# Patient Record
Sex: Female | Born: 1935 | Race: White | Hispanic: No | Marital: Married | Smoking: Former smoker
Health system: Southern US, Community
[De-identification: ages and names within clinical notes are randomized; demographics above are authoritative.]

## PROBLEM LIST (undated history)

## (undated) DIAGNOSIS — I10 Essential (primary) hypertension: Secondary | ICD-10-CM

## (undated) DIAGNOSIS — R9431 Abnormal electrocardiogram [ECG] [EKG]: Secondary | ICD-10-CM

## (undated) DIAGNOSIS — E785 Hyperlipidemia, unspecified: Secondary | ICD-10-CM

## (undated) HISTORY — DX: Abnormal electrocardiogram (ECG) (EKG): R94.31

## (undated) HISTORY — DX: Hyperlipidemia, unspecified: E78.5

## (undated) HISTORY — DX: Essential (primary) hypertension: I10

---

## 1969-12-21 HISTORY — PX: HEMORRHOID SURGERY: SHX153

## 1976-12-21 HISTORY — PX: OTHER SURGICAL HISTORY: SHX169

## 2004-12-21 HISTORY — PX: CATARACT EXTRACTION: SUR2

## 2005-08-11 ENCOUNTER — Ambulatory Visit: Payer: Self-pay | Admitting: Internal Medicine

## 2005-08-13 ENCOUNTER — Ambulatory Visit: Payer: Self-pay | Admitting: Cardiology

## 2005-09-02 ENCOUNTER — Ambulatory Visit: Payer: Self-pay | Admitting: Internal Medicine

## 2005-10-15 ENCOUNTER — Ambulatory Visit: Payer: Self-pay | Admitting: Internal Medicine

## 2005-11-26 ENCOUNTER — Ambulatory Visit: Payer: Self-pay | Admitting: Internal Medicine

## 2008-12-27 ENCOUNTER — Ambulatory Visit (HOSPITAL_BASED_OUTPATIENT_CLINIC_OR_DEPARTMENT_OTHER): Admission: RE | Admit: 2008-12-27 | Discharge: 2008-12-27 | Payer: Self-pay | Admitting: Orthopedic Surgery

## 2009-12-31 ENCOUNTER — Ambulatory Visit (HOSPITAL_BASED_OUTPATIENT_CLINIC_OR_DEPARTMENT_OTHER): Admission: RE | Admit: 2009-12-31 | Discharge: 2009-12-31 | Payer: Self-pay | Admitting: Orthopedic Surgery

## 2010-01-21 ENCOUNTER — Telehealth (INDEPENDENT_AMBULATORY_CARE_PROVIDER_SITE_OTHER): Payer: Self-pay | Admitting: *Deleted

## 2010-01-21 ENCOUNTER — Ambulatory Visit: Payer: Self-pay | Admitting: Cardiovascular Disease

## 2010-01-22 ENCOUNTER — Encounter (HOSPITAL_COMMUNITY): Admission: RE | Admit: 2010-01-22 | Discharge: 2010-03-27 | Payer: Self-pay | Admitting: Cardiovascular Disease

## 2010-01-22 ENCOUNTER — Ambulatory Visit: Payer: Self-pay | Admitting: Cardiovascular Disease

## 2010-01-22 ENCOUNTER — Ambulatory Visit: Payer: Self-pay

## 2010-04-09 ENCOUNTER — Ambulatory Visit: Payer: Self-pay | Admitting: Cardiovascular Disease

## 2010-08-07 ENCOUNTER — Ambulatory Visit: Payer: Self-pay | Admitting: Cardiovascular Disease

## 2011-01-20 NOTE — Assessment & Plan Note (Signed)
Summary: Cardiology Nuclear Study  Nuclear Med Background Indications for Stress Test: Evaluation for Ischemia   History: Abnormal EKG, Asthma, Myocardial Perfusion Study  History Comments: 5-10 yrs ago MPS:([facility])OK per patient  Symptoms: Chest Tightness, Diaphoresis, DOE, Palpitations  Symptoms Comments: CP>jaw and (L) arm. Last episode of UO:[id number] am, 01/19/10.    Nuclear Pre-Procedure Cardiac Risk Factors: Family History - CAD, History of Smoking, Hypertension, Lipids Caffeine/Decaff Intake: None NPO After: 5:30 PM Lungs: Clear.  O2 sat 98% on RA.  Albuterol inhaler, 2 puffs, prior to lexiscan. IV 0.9% NS with Angio Cath: 22g     IV Site: (R) AC IV Started by: Irven Baltimore RN Chest Size (in) 40     Cup Size C     Height (in): 59 Weight (lb): 148 BMI: 30.00  Nuclear Med Study 1 or 2 day study:  1 day     Stress Test Type:  Carlton Adam Reading MD:  Jenkins Rouge, MD     Referring MD:  Vivi Martens, MD Resting Radionuclide:  Technetium 60m Tetrofosmin     Resting Radionuclide Dose:  10.9 mCi  Stress Radionuclide:  Technetium 37m Tetrofosmin     Stress Radionuclide Dose:  33.0 mCi   Stress Protocol   Lexiscan: 0.4 mg   Stress Test Technologist:  Valetta Fuller CMA-N     Nuclear Technologist:  Mariann Laster Deal RT-N  Rest Procedure  Myocardial perfusion imaging was performed at rest 45 minutes following the intravenous administration of Myoview Technetium 66m Tetrofosmin.  Stress Procedure  The patient received IV Lexiscan 0.4 mg over 15-seconds.  Myoview injected at 30-seconds.  There were no significant changes with lexiscan.  She did c/o throat tightness.  Quantitative spect images were obtained after a 45 minute delay.  QPS Raw Data Images:  Normal; no motion artifact; normal heart/lung ratio. Stress Images:  NI: Uniform and normal uptake of tracer in all myocardial segments. Rest Images:  Normal homogeneous uptake in all areas of the myocardium. Subtraction  (SDS):  Normal Transient Ischemic Dilatation:  1.08  (Normal <1.22)  Lung/Heart Ratio:  .28  (Normal <0.45)  Quantitative Gated Spect Images QGS EDV:  38 ml QGS ESV:  7 ml QGS EF:  82 % QGS cine images:  Normal  Findings Normal nuclear study      Overall Impression  Exercise Capacity: Lexiscan BP Response: Normal blood pressure response. Clinical Symptoms: Throat Tightness ECG Impression: LVH Overall Impression: Normal stress nuclear study. Overall Impression Comments: Normal

## 2011-01-20 NOTE — Progress Notes (Signed)
Summary: Nuclear Pre-Procedure  Phone Note Outgoing Call Call back at [facility] Phone [phone]   Call placed by: Eliezer Lofts, EMT-P,  January 21, 2010 1:22 PM Call placed to: Patient Action Taken: Phone Call Completed Summary of Call: Reviewed information on Myoview Information Sheet (see scanned document for further details).  Spoke with Patient.    Nuclear Med Background Indications for Stress Test: Evaluation for Ischemia   History: Abnormal EKG   Symptoms: Chest Pain    Nuclear Pre-Procedure Cardiac Risk Factors: Hypertension, Lipids

## 2011-03-08 LAB — BASIC METABOLIC PANEL
BUN: 20 mg/dL (ref 6–23)
CO2: 28 mEq/L (ref 19–32)
Calcium: 9.5 mg/dL (ref 8.4–10.5)
Creatinine, Ser: 1.38 mg/dL — ABNORMAL HIGH (ref 0.4–1.2)
GFR calc Af Amer: 45 mL/min — ABNORMAL LOW (ref >60)
GFR calc non Af Amer: 37 mL/min — ABNORMAL LOW (ref >60)
Sodium: 138 mEq/L (ref 135–145)

## 2011-04-06 LAB — BASIC METABOLIC PANEL
Calcium: 9.3 mg/dL (ref 8.4–10.5)
GFR calc non Af Amer: 46 mL/min — ABNORMAL LOW (ref >60)

## 2011-05-05 NOTE — Assessment & Plan Note (Signed)
[place] [facility]ARDIOLOGY OFFICE NOTE   NAME:[name], [name] [name]                            MRN:          [id number]  DATE:01/21/2010                            DOB:          04-09-1933    CHIEF COMPLAINT:  Abnormal EKG.   HISTORY OF PRESENT ILLNESS:  [name] [name] [name] is 77 year old white female with a  past medical history significant for hypertension, hyperlipidemia,  hypothyroidism and a family history of premature coronary disease who is  presenting with an abnormal EKG and intermittent left arm discomfort.  The patient states that approximately 1 month ago, she was told that she  had an abnormal EKG after she had surgery on her hand that was  uneventful.  The patient states that around that time, she awoke around  2:00 a.m. with an aching in her left arm and left jaw as well as some  diaphoresis.  The patient states she took aspirin and the pain resolved  within 15 minutes.  Several nights later, she had an almost identical  episode.  She went several weeks without any difficulties.  However 2  days before this office visit while cooking breakfast, she had similar  type symptoms.  Of note, the patient states she used to have left arm  discomfort with exertion that was evaluated with a stress test  approximately 5 years ago, which was within normal limits.  The arm  discomfort eventually subsided.  She does not remember if there were  associated symptoms at that time.  Other than the above-mentioned  symptoms, the patient has been in her normal state of health.  She  denies any increase in dyspnea on exertion, lower extremity edema, PND  or orthopnea.   PAST MEDICAL HISTORY:  As above.  In addition, the patient has had a  cyst removed from her breast, bladder tack surgery, hemorrhoidectomy,  hysterectomy.   SOCIAL HISTORY:  She smoked for about 38 years.  However, she quit in  1991.  She does not drink significant amounts of  alcohol.   FAMILY HISTORY:  Positive for premature coronary disease and she  believes her mother developed obstructive coronary disease in her 30s.   ALLERGIES:  THE PATIENT STATES SHE HAD A RASH AFTER TAKING CARDIZEM AND  AUGMENTIN SO THEY TOLD HER NOT TO TAKE EITHER AGAIN.  SHE ALSO STATES AN  ALLERGY TO REGLAN, MORPHINE AND CODEINE.   MEDICATIONS:  1. Simvastatin 40 mg nightly.  2. Losartan/HCTZ 100/25 mg daily.  3. Ranitidine 300 mg nightly.  4. Omeprazole 20 mg b.i.d.  5. Isosorbide 30 mg daily.  6. Levothyroxine  50 mcg daily,  7. Fexofenadine 180 mg daily.   REVIEW OF SYSTEMS:  As in HPI.  Other systems were reviewed and are  negative.   PHYSICAL EXAM:  Blood pressure in the right arm 149/88, left arm is  152/82, pulse is 94, saturating 97% on room air.  She weighs 149 pounds.  GENERAL:  No acute distress.  HEENT:  Normocephalic, atraumatic.  NECK:  Supple.  There is no JVD.  No carotid bruits.  HEART:  Regular rate and rhythm without murmur, rub or gallop.  LUNGS:  Clear bilaterally.  ABDOMEN:  Soft, nontender, nondistended.  EXTREMITIES:  Without edema.  SKIN:  Warm and dry.  MUSCULOSKELETAL:  5/5 bilateral upper and lower extremity [name].  PSYCHIATRIC:  The patient is appropriate with normal levels of insight.   EKG taken today in the clinic, independently interrupted by myself,  demonstrates normal sinus rhythm with a 1-mm ST-segment depression and T-  wave inversion in leads V4-V6.  She has T-wave inversion in the inferior  leads as well.  Indeed, ST-segment changes may be secondary to LVH as  she has borderline criteria for left ventricular hypertrophy.  Compared  to EKG from 1 year ago dated January 2010, there is really no  significant change except the T-wave abnormalities in the inferior leads  are perhaps a bit more impressive.   REVIEW OF THE PATIENT'S LABS:  From October 2010:  BMP was within normal  limits including a creatinine of 1.1.  CBC was  within normal limits  including a hemoglobin of 13.8 and platelet count of 243.  Her TSH at  that point was 0.079 and an appropriate change to her Synthroid dosing  was made.  The patient's stress test from 5 years ago is not currently  available, but normal per her report.   ASSESSMENT AND PLAN:  This is a 77 year old female with multiple risk  factors for coronary disease presenting with symptoms that are  concerning for angina.  The EKG abnormalities are less concerning as  there is no significant change from 1 year ago.  The patient's blood  pressure is not controlled.   PLAN:  We talked about the risks and benefits of left heart  catheterization verses a noninvasive stress test.  At this point, we  will proceed with a nuclear stress test.  The patient is to start  aspirin 81 mg every day.  In addition, we will start her on Lopressor 25  mg b.i.d. in addition to her other antihypertensives as her blood  pressure is not yet at goal.  We will contact the patient once the  results of the stress test are obtained.     Arlee Muslim, MD  Electronically Signed    SGA/MedQ  DD: 01/21/2010  DT: 01/21/2010  Job #: [id number]

## 2011-05-05 NOTE — Assessment & Plan Note (Signed)
[place] CARDIOLOGY OFFICE NOTE   NAME:[name] [name]                            MRN:          [id number]  DATE:04/09/2010                            DOB:          1933/03/20    PROBLEM LIST:  1. Hypertension.  2. Hyperlipidemia.  3. Family history of premature coronary artery disease.   INTERVAL HISTORY:  The patient states she has been doing very well since  her last visit.  She is walking every other day approximately one mile.  It takes approximately 12 minutes. She denies any episodes of chest  discomfort or shortness of breath.  She also denies any dizziness,  syncope or lower extremity edema.  Her main problem tends to be from  bilateral hip pain secondary to arthritis.  She is compliant with her  medications and is tolerating them well.  She does endorse sleepiness  and fatigue in the afternoons.  She states her husband says that she  snores.   PHYSICAL EXAMINATION:  VITAL SIGNS:  Blood pressure 128/73, pulse is 74,  weight is 152 pounds, and satting 97% on room air.  GENERAL:  She is in no acute distress.  HEENT:  Normocephalic, atraumatic.  HEART:  Regular rate and rhythm without murmur, rub or gallop.  LUNGS:  Clear bilaterally.  ABDOMEN:  Soft, nontender, nondistended.  EXTREMITIES:  Without edema.  SKIN:  Warm and dry.   Review of the patient's stress test since the last office visit, she had  an ejection fraction calculated to be 82%.  There is no evidence of  ischemia of the study.   ASSESSMENT AND PLAN:  A 77 year old female with multiple risk factors of  coronary artery disease who is not having any symptoms consistent with  angina, heart failure or arrhythmia.  She has had a negative nuclear  stress test that was ordered secondary to some intermittent left arm  discomfort and an abnormal EKG.  The patient's daytime fatigue and  somnolence is concerning for obstructive sleep apnea.  We have  offered  her a sleep test and she states that she will discuss this with her  husband then get back to [place].  I will see her back in 4 months' time.     Arlee Muslim, MD  Electronically Signed    SGA/MedQ  DD: 04/09/2010  DT: 04/10/2010  Job #: [id number]

## 2011-05-05 NOTE — Assessment & Plan Note (Signed)
[place] CARDIOLOGY OFFICE NOTE   NAME:[name] [name], [name] [name]                            MRN:          [id number]  DATE:08/07/2010                            DOB:          August 14, 1933    PROBLEM LIST:  1. Hypertension.  2. Hyperlipidemia.  3. Family history of premature coronary artery disease.  4. Abnormal baseline ECG with inverted T-waves in the anterior leads      with negative nuclear stress testing.   INTERVAL HISTORY:  The patient overall is doing very well.  She denies  any chest pain, neck pain, or dyspnea.  She has not had any  palpitations, presyncope, or syncope.  Her blood pressure has been  better controlled.  She is not reporting any side effects with  medications.  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   MEDICATIONS:  1. Levothyroxine once daily.  2. Isosorbide 30 mg once daily.  3. Losartan/hydrochlorothiazide 100/25 mg once daily.  4. Ranitidine 300 mg once daily.  5. Omeprazole 20 mg twice daily.  6. Klor-Con 20 mEq once daily.  7. Simvastatin 40 mg at bedtime.  8. Multivitamin once daily.  9. Amlodipine 10 mg daily.  10.Lopressor 50 mg half a tablet twice daily.  11.Aspirin 81 mg once daily.  12.Zoloft 50 mg once daily.   PHYSICAL EXAMINATION:  VITAL SIGNS:  Weight is 152.4 pounds, blood  pressure is 123/69, pulse is 55, oxygen saturation is 99% on room air.  NECK:  No JVD or carotid bruits.  LUNGS:  Clear to auscultation.  HEART:  Regular rate and rhythm with no gallops or murmurs.  ABDOMEN:  Benign, nontender, nondistended.  EXTREMITIES:  No clubbing, cyanosis, or edema.   IMPRESSION:  1. Previous atypical left arm pain and abnormal ECG.  This was      followed by a nuclear stress test which showed no signs of ischemia      with normal ejection fraction.  At this time, she is not having any      symptoms suggestive of angina.  I recommend treating  her underlying      risk factors as is being done.  We will continue with aspirin for      now.  She does have significant ECG changes that could possibly      have endothelial dysfunction.  2. Hypertension.  Blood pressure is now well controlled with current      medications.  3. Hyperlipidemia.  Her most recent lipid profile showed a total      cholesterol of 179, triglyceride 151, HDL 51, and LDL of 98.  I      think this is at target.  Her LDL target should be less than 100.      The patient will follow up with [place] as needed based on her symptoms.     Kathlyn Sacramento, MD  Electronically Signed    MA/MedQ  DD: 08/07/2010  DT: 08/08/2010  Job #: [id number]

## 2011-05-05 NOTE — Op Note (Signed)
NAME:  [name] [name], [name] [name]                   ACCOUNT NO.:  [id number]   MEDICAL RECORD NO.:  [id number]          PATIENT TYPE:  AMB   LOCATION:  [facility]                          FACILITY:  [facility]   PHYSICIAN:  Daryll Brod, M.D.       DATE OF BIRTH:  1933-10-03   DATE OF PROCEDURE:  12/27/2008  DATE OF DISCHARGE:                               OPERATIVE REPORT   PREOPERATIVE DIAGNOSIS:  Stenosing tenosynovitis, left middle finger.   POSTOPERATIVE DIAGNOSIS:  Stenosing tenosynovitis, left middle finger.   OPERATION:  Release A1 pulley, left middle finger.   SURGEON:  Daryll Brod, MD   ASSISTANT:  Dimas Millin, RN   ANESTHESIA:  Forearm based IV regional.   HISTORY:  The patient is a 77 year old female with a history of  triggering of left middle finger.  This has not responded to  conservative treatment.  She has elected to undergo surgical release.  Her postoperative course was discussed along with the risks and  complications.  She is aware that there is no guarantee with the  surgery; possibility of infection; recurrence; injury to arteries,  nerves, tendons; incomplete relief of symptoms; and dystrophy.  In the  preoperative area, the patient was seen.  The extremity was marked by  both the patient and surgeon.  Antibiotics given.   PROCEDURE:  The patient was brought to the operating room where a  forearm based IV regional anesthetic was carried out without difficulty.  She was prepped using DuraPrep in the supine position, left arm free.  The block was undertaken under the direction of Dr. Ola Spurr.  Time-  out was taken.  An oblique incision was made over the A1 pulley, left  middle finger.  She had some feeling.  A local anesthetic was added with  1% Xylocaine without epinephrine.  The wound was deepened to the flexor  sheath.  Retractors were placed protecting the neurovascular bundles  radially and ulnarly.  An incision was then made on the radial aspect of  the A1 pulley.  A small incision  was made centrally in the A2 pulley.  No further lesions were identified.  The finger placed through full  range motion, no further triggering was noted.  The wound was irrigated.  Skin was then closed with interrupted 5-0 Vicryl Rapide sutures.  A  sterile compressive dressing to the hand was applied, fingers free.  The  patient tolerated the procedure well and was taken to the recovery room  for observation in satisfactory condition.  She will be discharged home  to return to the [facility] in 1 week on Darvocet-N 100.           ______________________________  Daryll Brod, M.D.     GK/MEDQ  D:  12/27/2008  T:  12/28/2008  Job:  [id number]

## 2011-05-05 NOTE — Letter (Signed)
January 21, 2010    Raynelle Bring, DO  [facility]  [address]  [place], [place]  [address]   RE:  MCKENA, KRUER  MRN:  [id number]  /  DOB:  24-Jan-1933   Dear Dr. Cathleen Fears:   I had the pleasure of seeing your patient, Nicolas Eisenhauer, in Cardiology  Clinic this morning.  As you know she is a 77 year old female with  multiple risk factors for coronary disease who is now having some  symptoms that are concerning for angina.  She also had an abnormal EKG,  but in reviewing her history the EKG is largely unchanged from January  2010.  We discussed at length the risks and benefits of heart  catheterization versus a nuclear stress test, and will proceed with an  initial stress test.  I have also asked her to start taking aspirin on a  daily basis, and have added Lopressor 25 mg twice a day to her medical  regimen.   Thank you for the referral of this patient, and I look forward to  following her along with you.  Please free to contact my office anytime  with any questions or concerns.    Sincerely,      Arlee Muslim, MD  Electronically Signed    SGA/MedQ  DD: 01/21/2010  DT: 01/21/2010  Job #: [id number]

## 2011-05-06 ENCOUNTER — Other Ambulatory Visit: Payer: Self-pay | Admitting: Cardiovascular Disease

## 2011-06-16 ENCOUNTER — Encounter: Payer: Self-pay | Admitting: Cardiovascular Disease

## 2011-09-13 ENCOUNTER — Encounter: Payer: Self-pay | Admitting: Cardiovascular Disease

## 2011-10-12 ENCOUNTER — Other Ambulatory Visit: Payer: Self-pay | Admitting: Cardiovascular Disease

## 2011-10-19 ENCOUNTER — Encounter: Payer: Self-pay | Admitting: Cardiovascular Disease

## 2012-02-03 ENCOUNTER — Other Ambulatory Visit: Payer: Self-pay | Admitting: Cardiovascular Disease

## 2013-02-16 ENCOUNTER — Other Ambulatory Visit: Payer: Self-pay | Admitting: Cardiovascular Disease

## 2013-02-17 ENCOUNTER — Telehealth: Payer: Self-pay | Admitting: *Deleted

## 2013-02-17 NOTE — Telephone Encounter (Signed)
Pt pharmacy requesting refill called pt to schedule appointment for future refills left message to call back.

## 2016-02-13 DIAGNOSIS — M1990 Unspecified osteoarthritis, unspecified site: Secondary | ICD-10-CM | POA: Insufficient documentation

## 2016-02-13 DIAGNOSIS — J449 Chronic obstructive pulmonary disease, unspecified: Secondary | ICD-10-CM | POA: Insufficient documentation

## 2016-02-13 DIAGNOSIS — F411 Generalized anxiety disorder: Secondary | ICD-10-CM | POA: Insufficient documentation

## 2016-02-13 DIAGNOSIS — I129 Hypertensive chronic kidney disease with stage 1 through stage 4 chronic kidney disease, or unspecified chronic kidney disease: Secondary | ICD-10-CM | POA: Insufficient documentation

## 2016-02-13 DIAGNOSIS — E559 Vitamin D deficiency, unspecified: Secondary | ICD-10-CM | POA: Insufficient documentation

## 2016-02-13 DIAGNOSIS — K219 Gastro-esophageal reflux disease without esophagitis: Secondary | ICD-10-CM | POA: Insufficient documentation

## 2016-02-13 DIAGNOSIS — E039 Hypothyroidism, unspecified: Secondary | ICD-10-CM | POA: Insufficient documentation

## 2016-02-13 DIAGNOSIS — R7301 Impaired fasting glucose: Secondary | ICD-10-CM | POA: Insufficient documentation

## 2016-02-13 DIAGNOSIS — K449 Diaphragmatic hernia without obstruction or gangrene: Secondary | ICD-10-CM | POA: Insufficient documentation

## 2016-02-13 DIAGNOSIS — N184 Chronic kidney disease, stage 4 (severe): Secondary | ICD-10-CM

## 2016-02-13 DIAGNOSIS — B0229 Other postherpetic nervous system involvement: Secondary | ICD-10-CM | POA: Insufficient documentation

## 2016-02-13 DIAGNOSIS — J309 Allergic rhinitis, unspecified: Secondary | ICD-10-CM | POA: Insufficient documentation

## 2016-02-13 DIAGNOSIS — E7849 Other hyperlipidemia: Secondary | ICD-10-CM | POA: Insufficient documentation

## 2016-04-17 DIAGNOSIS — D509 Iron deficiency anemia, unspecified: Secondary | ICD-10-CM | POA: Insufficient documentation

## 2016-06-24 DIAGNOSIS — H9190 Unspecified hearing loss, unspecified ear: Secondary | ICD-10-CM | POA: Insufficient documentation

## 2018-03-21 DIAGNOSIS — R197 Diarrhea, unspecified: Secondary | ICD-10-CM | POA: Insufficient documentation

## 2018-03-21 DIAGNOSIS — R911 Solitary pulmonary nodule: Secondary | ICD-10-CM | POA: Insufficient documentation

## 2018-03-21 DIAGNOSIS — R5383 Other fatigue: Secondary | ICD-10-CM | POA: Insufficient documentation

## 2018-04-21 DIAGNOSIS — I2510 Atherosclerotic heart disease of native coronary artery without angina pectoris: Secondary | ICD-10-CM | POA: Insufficient documentation

## 2018-05-23 DIAGNOSIS — L0390 Cellulitis, unspecified: Secondary | ICD-10-CM | POA: Insufficient documentation

## 2018-08-05 DIAGNOSIS — M542 Cervicalgia: Secondary | ICD-10-CM | POA: Insufficient documentation

## 2018-10-09 DIAGNOSIS — M25552 Pain in left hip: Secondary | ICD-10-CM | POA: Insufficient documentation

## 2018-10-09 DIAGNOSIS — M5136 Other intervertebral disc degeneration, lumbar region: Secondary | ICD-10-CM | POA: Insufficient documentation

## 2018-10-26 DIAGNOSIS — E876 Hypokalemia: Secondary | ICD-10-CM | POA: Insufficient documentation

## 2018-10-26 DIAGNOSIS — N289 Disorder of kidney and ureter, unspecified: Secondary | ICD-10-CM | POA: Insufficient documentation

## 2018-12-15 DIAGNOSIS — R000 Tachycardia, unspecified: Secondary | ICD-10-CM | POA: Insufficient documentation

## 2018-12-15 DIAGNOSIS — M25572 Pain in left ankle and joints of left foot: Secondary | ICD-10-CM | POA: Insufficient documentation

## 2018-12-16 DIAGNOSIS — I10 Essential (primary) hypertension: Secondary | ICD-10-CM

## 2018-12-16 DIAGNOSIS — J449 Chronic obstructive pulmonary disease, unspecified: Secondary | ICD-10-CM

## 2018-12-16 DIAGNOSIS — N183 Chronic kidney disease, stage 3 (moderate): Secondary | ICD-10-CM

## 2018-12-16 DIAGNOSIS — I340 Nonrheumatic mitral (valve) insufficiency: Secondary | ICD-10-CM

## 2018-12-16 DIAGNOSIS — I4892 Unspecified atrial flutter: Secondary | ICD-10-CM | POA: Diagnosis not present

## 2018-12-16 DIAGNOSIS — R112 Nausea with vomiting, unspecified: Secondary | ICD-10-CM

## 2018-12-16 DIAGNOSIS — I361 Nonrheumatic tricuspid (valve) insufficiency: Secondary | ICD-10-CM

## 2018-12-16 DIAGNOSIS — I4891 Unspecified atrial fibrillation: Secondary | ICD-10-CM

## 2018-12-17 DIAGNOSIS — I4892 Unspecified atrial flutter: Secondary | ICD-10-CM

## 2018-12-17 DIAGNOSIS — N183 Chronic kidney disease, stage 3 (moderate): Secondary | ICD-10-CM

## 2018-12-17 DIAGNOSIS — E876 Hypokalemia: Secondary | ICD-10-CM | POA: Diagnosis not present

## 2018-12-22 DIAGNOSIS — R233 Spontaneous ecchymoses: Secondary | ICD-10-CM | POA: Insufficient documentation

## 2018-12-22 DIAGNOSIS — I483 Typical atrial flutter: Secondary | ICD-10-CM | POA: Insufficient documentation

## 2018-12-22 DIAGNOSIS — R7989 Other specified abnormal findings of blood chemistry: Secondary | ICD-10-CM | POA: Insufficient documentation

## 2019-01-02 DIAGNOSIS — L239 Allergic contact dermatitis, unspecified cause: Secondary | ICD-10-CM | POA: Insufficient documentation

## 2019-01-03 ENCOUNTER — Ambulatory Visit (INDEPENDENT_AMBULATORY_CARE_PROVIDER_SITE_OTHER): Payer: Medicare Other | Admitting: Cardiology

## 2019-01-03 ENCOUNTER — Encounter: Payer: Self-pay | Admitting: Cardiology

## 2019-01-03 VITALS — BP 126/72 | HR 118 | Ht <= 58 in | Wt 128.0 lb

## 2019-01-03 DIAGNOSIS — I129 Hypertensive chronic kidney disease with stage 1 through stage 4 chronic kidney disease, or unspecified chronic kidney disease: Secondary | ICD-10-CM

## 2019-01-03 DIAGNOSIS — I480 Paroxysmal atrial fibrillation: Secondary | ICD-10-CM

## 2019-01-03 DIAGNOSIS — I2510 Atherosclerotic heart disease of native coronary artery without angina pectoris: Secondary | ICD-10-CM | POA: Diagnosis not present

## 2019-01-03 DIAGNOSIS — N289 Disorder of kidney and ureter, unspecified: Secondary | ICD-10-CM | POA: Diagnosis not present

## 2019-01-03 DIAGNOSIS — N184 Chronic kidney disease, stage 4 (severe): Secondary | ICD-10-CM

## 2019-01-03 MED ORDER — METOPROLOL TARTRATE 50 MG PO TABS: 100.0000 mg | ORAL_TABLET | Freq: Two times a day (BID) | ORAL | 2 refills | Status: DC

## 2019-01-03 MED ORDER — RIVAROXABAN 15 MG PO TABS: 15.0000 mg | ORAL_TABLET | Freq: Every day | ORAL | 0 refills | Status: DC

## 2019-01-03 NOTE — Progress Notes (Signed)
Cardiology Office Note:    Date:  01/03/2019   ID:  [name] [name], DOB 05-22-1933, MRN [id number]  PCP:  Maggie Schwalbe, PA-C  Cardiologist:  Jenean Lindau, MD   Referring MD: Maggie Schwalbe, PA-C    ASSESSMENT:    1. Paroxysmal atrial fibrillation (HCC)   2. Benign hypertension with CKD (chronic kidney disease) stage IV ([place])   3. Coronary artery disease involving native coronary artery of native heart without angina pectoris   4. Renal insufficiency    PLAN:    In order of problems listed above:  1. I discussed my findings with the patient at extensive length and [facility] records were reviewed. 2. I discussed with the patient atrial fibrillation, disease process. Management and therapy including rate and rhythm control, anticoagulation benefits and potential risks were discussed extensively with the patient. Patient had multiple questions which were answered to patient's satisfaction. 3. I discussed with the patient that she is in atrial fibrillation now.  I made the following changes to her medications.  I stopped amlodipine and isosorbide.  I doubled up on the metoprolol to 100 mg daily.  She had a rash from Eliquis and she is willing to try the medications we will give her Xarelto 15 mg daily.  She will be back in 1 month.  At that time we will reevaluate her.  She will keep a track of her pulse and blood pressures at home and send it to [place] in a week.  She will be seen in follow-up appointment in a month or earlier if she has any concerns.  At that time we will consider her for cardioversion.  She knows to go to the nearest emergency room for any significant concerns.  She and her daughter had multiple questions which were answered to their satisfaction.   Medication Adjustments/Labs and Tests Ordered: Current medicines are reviewed at length with the patient today.  Concerns regarding medicines are outlined above.  No orders of the defined types were placed in this  encounter.  No orders of the defined types were placed in this encounter.    No chief complaint on file.    History of Present Illness:    [name] [name] is a 85 y.o. female.  Patient was at [facility] with atrial fibrillation and rapid ventricular rate.  I reviewed the records extensively and it mentions that the patient converted to sinus rhythm.  Patient was discharged home on Eliquis 2.5 mg twice daily but she has stopped taking it because of a rash.  No chest pain orthopnea or PND.  She complains of feeling tired on exertion.  She has renal insufficiency.  Her daughter is very supportive and accompanies her for this visit.  At the time of my evaluation, the patient is alert awake oriented and in no distress.  Past Medical History:  Diagnosis Date  . Abnormal EKG   . HLD (hyperlipidemia)   . HTN (hypertension)     Past Surgical History:  Procedure Laterality Date  . CATARACT EXTRACTION  2006  . [place]  . hysterectomy - unknown type  1978    Current Medications: Current Meds  Medication Sig  . amLODipine (NORVASC) 10 MG tablet Take 10 mg by mouth daily.    . diazepam (VALIUM) 2 MG tablet Take 2 mg by mouth as needed.    . isosorbide mononitrate (IMDUR) 30 MG 24 hr tablet Take 30 mg by mouth daily.    Marland Kitchen levothyroxine (  SYNTHROID, LEVOTHROID) 75 MCG tablet Take 75 mcg by mouth daily.   . metoprolol (LOPRESSOR) 50 MG tablet TAKE 1/2 TABLET BY MOUTH TWICE DAILY. (Patient taking differently: Take 50 mg by mouth 2 (two) times daily. )  . montelukast (SINGULAIR) 10 MG tablet Take 1 tablet by mouth daily.  Marland Kitchen omeprazole (PRILOSEC) 20 MG capsule Take 20 mg by mouth 2 (two) times daily.    . simvastatin (ZOCOR) 20 MG tablet Take 20 mg by mouth at bedtime.      Allergies:   Amoxicillin-pot clavulanate; Codeine; Metoclopramide hcl; Morphine; Nadolol; Sulfamethoxazole-trimethoprim; and Valacyclovir   Social History   Socioeconomic History  . Marital status:  Married    Spouse name: Not on file  . Number of children: Not on file  . Years of education: Not on file  . Highest education level: Not on file  Occupational History  . Occupation: Retired  Scientific laboratory technician  . Financial resource strain: Not on file  . Food insecurity:    Worry: Not on file    Inability: Not on file  . Transportation needs:    Medical: Not on file    Non-medical: Not on file  Tobacco Use  . Smoking status: Former Smoker    Last attempt to quit: 05/21/1990    Years since quitting: 28.6  . Smokeless tobacco: Never Used  Substance and Sexual Activity  . Alcohol use: Not on file  . Drug use: No  . Sexual activity: Not on file  Lifestyle  . Physical activity:    Days per week: Not on file    Minutes per session: Not on file  . Stress: Not on file  Relationships  . Social connections:    Talks on phone: Not on file    Gets together: Not on file    Attends religious service: Not on file    Active member of club or organization: Not on file    Attends meetings of clubs or organizations: Not on file    Relationship status: Not on file  Other Topics Concern  . Not on file  Social History Narrative  . Not on file     Family History: The patient's family history includes Aneurysm in her unknown relative; Cancer in her unknown relative.  ROS:   Please see the history of present illness.    All other systems reviewed and are negative.  EKGs/Labs/Other Studies Reviewed:    The following studies were reviewed today: EKG reveals atrial fibrillation with elevated ventricular rate.   Recent Labs: No results found for requested labs within last 8760 hours.  Recent Lipid Panel No results found for: CHOL, TRIG, HDL, CHOLHDL, VLDL, LDLCALC, LDLDIRECT  Physical Exam:    VS:  BP 126/72 (BP Location: Right Arm, Patient Position: Sitting, Cuff Size: Normal)   Pulse (!) 118   Ht 4\' 10"  (1.473 m)   Wt 128 lb (58.1 kg)   SpO2 93%   BMI 26.75 kg/m     Wt Readings  from Last 3 Encounters:  01/03/19 128 lb (58.1 kg)     GEN: Patient is in no acute distress HEENT: Normal NECK: No JVD; No carotid bruits LYMPHATICS: No lymphadenopathy CARDIAC: Hear sounds regular, 2/6 systolic murmur at the apex. RESPIRATORY:  Clear to auscultation without rales, wheezing or rhonchi  ABDOMEN: Soft, non-tender, non-distended MUSCULOSKELETAL:  No edema; No deformity  SKIN: Warm and dry NEUROLOGIC:  Alert and oriented x 3 PSYCHIATRIC:  Normal affect   Signed, Reita Cliche Diamonique Ruedas,  MD  01/03/2019 9:43 AM    [facility]

## 2019-01-03 NOTE — Addendum Note (Signed)
Addended by: Orland Penman on: 01/03/2019 10:04 AM   Modules accepted: Orders

## 2019-01-03 NOTE — Patient Instructions (Addendum)
Medication Instructions:   Your physician has recommended you make the following change in your medication:   CHANGE: Metoprolol 50 mg twice daily to NOW Metoprolol 100 mg by mouth twice daily.   START:  Xarelto 15 mg by mouth once daily.   STOP:  Isosorbide 30 mg   STOP:  Amlodipine 10 mg      If you need a refill on your cardiac medications before your next appointment, please call your pharmacy.   Lab work:  NONE   If you have labs (blood work) drawn today and your tests are completely normal, you will receive your results only by: Marland Kitchen MyChart Message (if you have MyChart) OR . A paper copy in the mail If you have any lab test that is abnormal or we need to change your treatment, we will call you to review the results.  Testing/Procedures:  NONE   Follow-Up: At [facility], you and your health needs are our priority.  As part of our continuing mission to provide you with exceptional heart care, we have created designated Provider Care Teams.  These Care Teams include your primary Cardiologist (physician) and Advanced Practice Providers (APPs -  Physician Assistants and Nurse Practitioners) who all work together to provide you with the care you need, when you need it.  You will need a follow up appointment in 1 months.    Rivaroxaban oral tablets What is this medicine? RIVAROXABAN (ri va ROX a ban) is an anticoagulant (blood thinner). It is used to treat blood clots in the lungs or in the veins. It is also used after knee or hip surgeries to prevent blood clots. It is also used to lower the chance of stroke in people with a medical condition called atrial fibrillation. This medicine may be used for other purposes; ask your health care provider or pharmacist if you have questions. COMMON BRAND NAME(S): Xarelto, Xarelto Starter Pack What should I tell my health care provider before I take this medicine? They need to know if you have any of these conditions: -bleeding  disorders -bleeding in the brain -blood in your stools (black or tarry stools) or if you have blood in your vomit -history of stomach bleeding -kidney disease -liver disease -low blood counts, like low white cell, platelet, or red cell counts -recent or planned spinal or epidural procedure -take medicines that treat or prevent blood clots -an unusual or allergic reaction to rivaroxaban, other medicines, foods, dyes, or preservatives -pregnant or trying to get pregnant -breast-feeding How should I use this medicine? Take this medicine by mouth with a glass of water. Follow the directions on the prescription label. Take your medicine at regular intervals. Do not take it more often than directed. Do not stop taking except on your doctor's advice. Stopping this medicine may increase your risk of a blood clot. Be sure to refill your prescription before you run out of medicine. If you are taking this medicine after hip or knee replacement surgery, take it with or without food. If you are taking this medicine for atrial fibrillation, take it with your evening meal. If you are taking this medicine to treat blood clots, take it with food at the same time each day. If you are unable to swallow your tablet, you may crush the tablet and mix it in applesauce. Then, immediately eat the applesauce. You should eat more food right after you eat the applesauce containing the crushed tablet. Talk to your pediatrician regarding the use of this medicine  in children. Special care may be needed. Overdosage: If you think you have taken too much of this medicine contact a poison control center or emergency room at once. NOTE: This medicine is only for you. Do not share this medicine with others. What if I miss a dose? If you take your medicine once a day and miss a dose, take the missed dose as soon as you remember. If it is almost time for your next dose, take only that dose. Do not take double or extra doses. If you  take your medicine twice a day and miss a dose, take the missed dose immediately. In this instance, 2 tablets may be taken at the same time. The next day you should take 1 tablet twice a day as directed. What may interact with this medicine? Do not take this medicine with any of the following medications: -defibrotide This medicine may also interact with the following medications: -aspirin and aspirin-like medicines -certain antibiotics like erythromycin, azithromycin, and clarithromycin -certain medicines for fungal infections like ketoconazole and itraconazole -certain medicines for irregular heart beat like amiodarone, quinidine, dronedarone -certain medicines for seizures like carbamazepine, phenytoin -certain medicines that treat or prevent blood clots like warfarin, enoxaparin, and dalteparin -conivaptan -felodipine -indinavir -lopinavir; ritonavir -NSAIDS, medicines for pain and inflammation, like ibuprofen or naproxen -ranolazine -rifampin -ritonavir -SNRIs, medicines for depression, like desvenlafaxine, duloxetine, levomilnacipran, venlafaxine -SSRIs, medicines for depression, like citalopram, escitalopram, fluoxetine, fluvoxamine, paroxetine, sertraline -St. John's wort -verapamil This list may not describe all possible interactions. Give your health care provider a list of all the medicines, herbs, non-prescription drugs, or dietary supplements you use. Also tell them if you smoke, drink alcohol, or use illegal drugs. Some items may interact with your medicine. What should I watch for while using this medicine? Visit your healthcare professional for regular checks on your progress. You may need blood work done while you are taking this medicine. Your condition will be monitored carefully while you are receiving this medicine. It is important not to miss any appointments. Avoid sports and activities that might cause injury while you are using this medicine. Severe falls or  injuries can cause unseen bleeding. Be careful when using sharp tools or knives. Consider using an Copy. Take special care brushing or flossing your teeth. Report any injuries, bruising, or red spots on the skin to your healthcare professional. If you are going to need surgery or other procedure, tell your healthcare professional that you are taking this medicine. Wear a medical ID bracelet or chain. Carry a card that describes your disease and details of your medicine and dosage times. What side effects may I notice from receiving this medicine? Side effects that you should report to your doctor or health care professional as soon as possible: -allergic reactions like skin rash, itching or hives, swelling of the face, lips, or tongue -back pain -redness, blistering, peeling or loosening of the skin, including inside the mouth -signs and symptoms of bleeding such as bloody or black, tarry stools; red or dark-brown urine; spitting up blood or brown material that looks like coffee grounds; red spots on the skin; unusual bruising or bleeding from the eye, gums, or nose -signs and symptoms of a blood clot such as chest pain; shortness of breath; pain, swelling, or warmth in the leg -signs and symptoms of a stroke such as changes in vision; confusion; trouble speaking or understanding; severe headaches; sudden numbness or weakness of the face, arm or leg; trouble walking; dizziness; loss  of coordination Side effects that usually do not require medical attention (report to your doctor or health care professional if they continue or are bothersome): -dizziness -muscle pain This list may not describe all possible side effects. Call your doctor for medical advice about side effects. You may report side effects to FDA at 1-800-FDA-1088. Where should I keep my medicine? Keep out of the reach of children. Store at room temperature between 15 and 30 degrees C (59 and 86 degrees F). Throw away any  unused medicine after the expiration date. NOTE: This sheet is a summary. It may not cover all possible information. If you have questions about this medicine, talk to your doctor, pharmacist, or health care provider.  2019 Elsevier/Gold Standard (2017-12-02 11:37:12)

## 2019-01-04 NOTE — Addendum Note (Signed)
Addended by: Tarri Glenn on: 01/04/2019 10:43 AM   Modules accepted: Orders

## 2019-01-09 ENCOUNTER — Telehealth: Payer: Self-pay | Admitting: Cardiology

## 2019-01-09 ENCOUNTER — Other Ambulatory Visit: Payer: Self-pay

## 2019-01-09 DIAGNOSIS — I480 Paroxysmal atrial fibrillation: Secondary | ICD-10-CM

## 2019-01-09 DIAGNOSIS — I2510 Atherosclerotic heart disease of native coronary artery without angina pectoris: Secondary | ICD-10-CM

## 2019-01-09 MED ORDER — METOPROLOL TARTRATE 50 MG PO TABS: 100.0000 mg | ORAL_TABLET | Freq: Two times a day (BID) | ORAL | 2 refills | Status: DC

## 2019-01-09 NOTE — Telephone Encounter (Signed)
Call [facility]oprolol to [facility] on [facility]

## 2019-01-09 NOTE — Telephone Encounter (Signed)
Refill sent task complete. 

## 2019-01-12 ENCOUNTER — Telehealth: Payer: Self-pay | Admitting: Cardiology

## 2019-01-12 DIAGNOSIS — N184 Chronic kidney disease, stage 4 (severe): Principal | ICD-10-CM

## 2019-01-12 DIAGNOSIS — I129 Hypertensive chronic kidney disease with stage 1 through stage 4 chronic kidney disease, or unspecified chronic kidney disease: Secondary | ICD-10-CM

## 2019-01-12 NOTE — Telephone Encounter (Signed)
Spoke to patient's husband regarding her blood pressure and heart rate. He reports patient's blood pressure got up to 145/97 today and heart rate 107. During the call patient's blood pressure was 127/88 and heart rate 106. She takes metoprolol tartrate 100 mg twice daily. She has already taken her morning dose. She denies any shortness of breath, pain, or dizziness. I advised her to taken or evening dose as she normally would and monitor heart rate and blood pressure and let us know if it gets any higher. Patient's husband verbally understands and will inform patient.

## 2019-01-12 NOTE — Telephone Encounter (Signed)
Would like to see a copy of her recent blood work including TSH CBC etc.

## 2019-01-12 NOTE — Telephone Encounter (Signed)
In the future, let us make sure that such messages are to be triaged appropriately.  This message should have come to the nurse who should call the patient with more details!!

## 2019-01-12 NOTE — Telephone Encounter (Signed)
Please advise 

## 2019-01-12 NOTE — Telephone Encounter (Signed)
Her heart rate is up and down and her BP is up

## 2019-01-13 NOTE — Telephone Encounter (Signed)
Attempted to call to see if pt has had any recent lab work, and if not inform her that [facility] would like to have a Lab work up.

## 2019-01-13 NOTE — Telephone Encounter (Signed)
Left message for patient to return call.

## 2019-01-17 NOTE — Telephone Encounter (Signed)
Yes get her in for a TSH please thank you.  Tell her to keep a track of pulse blood pressure.

## 2019-01-17 NOTE — Telephone Encounter (Signed)
Patient's husband informed to have labs drawn for tsh. They verbally understand and patient will have them drawn tomorrow.

## 2019-01-17 NOTE — Telephone Encounter (Signed)
Get tsh

## 2019-01-17 NOTE — Addendum Note (Signed)
Addended by: Ashok Norris on: 01/17/2019 10:07 AM   Modules accepted: Orders

## 2019-01-19 ENCOUNTER — Telehealth: Payer: Self-pay | Admitting: Emergency Medicine

## 2019-01-19 DIAGNOSIS — I480 Paroxysmal atrial fibrillation: Secondary | ICD-10-CM

## 2019-01-19 LAB — TSH: TSH: 2.38 u[IU]/mL (ref 0.450–4.500)

## 2019-01-19 NOTE — Telephone Encounter (Signed)
Patient informed of lab results and informed that Dr. Geraldo Pitter would like for patient to wear a heart monitor for 48 hours. Patient verbally understands.

## 2019-01-25 ENCOUNTER — Ambulatory Visit (INDEPENDENT_AMBULATORY_CARE_PROVIDER_SITE_OTHER): Payer: Medicare Other

## 2019-01-25 DIAGNOSIS — I480 Paroxysmal atrial fibrillation: Secondary | ICD-10-CM | POA: Diagnosis not present

## 2019-01-30 ENCOUNTER — Telehealth: Payer: Self-pay | Admitting: Emergency Medicine

## 2019-01-30 DIAGNOSIS — I480 Paroxysmal atrial fibrillation: Secondary | ICD-10-CM

## 2019-01-30 NOTE — Telephone Encounter (Signed)
Patient informed of monitor results and Dr. Julien Nordmann recommendation to see ep. Referral has been placed.

## 2019-01-31 ENCOUNTER — Encounter: Payer: Self-pay | Admitting: Cardiology

## 2019-01-31 ENCOUNTER — Telehealth: Payer: Self-pay | Admitting: Cardiology

## 2019-01-31 ENCOUNTER — Ambulatory Visit (INDEPENDENT_AMBULATORY_CARE_PROVIDER_SITE_OTHER): Payer: Medicare Other | Admitting: Cardiology

## 2019-01-31 ENCOUNTER — Telehealth (HOSPITAL_COMMUNITY): Payer: Self-pay | Admitting: *Deleted

## 2019-01-31 VITALS — BP 122/70 | HR 58 | Ht <= 58 in | Wt 128.0 lb

## 2019-01-31 DIAGNOSIS — I2510 Atherosclerotic heart disease of native coronary artery without angina pectoris: Secondary | ICD-10-CM

## 2019-01-31 DIAGNOSIS — I480 Paroxysmal atrial fibrillation: Secondary | ICD-10-CM

## 2019-01-31 LAB — HEPATIC FUNCTION PANEL
ALK PHOS: 71 IU/L (ref 39–117)
ALT: 9 IU/L (ref 0–32)
AST: 15 IU/L (ref 0–40)
Albumin: 3.5 g/dL — ABNORMAL LOW (ref 3.6–4.6)
Bilirubin Total: 0.6 mg/dL (ref 0.0–1.2)
Bilirubin, Direct: 0.22 mg/dL (ref 0.00–0.40)
TOTAL PROTEIN: 5.8 g/dL — AB (ref 6.0–8.5)

## 2019-01-31 LAB — BASIC METABOLIC PANEL
BUN / CREAT RATIO: 18 (ref 12–28)
BUN: 21 mg/dL (ref 8–27)
CO2: 26 mmol/L (ref 20–29)
Calcium: 8.9 mg/dL (ref 8.7–10.3)
Chloride: 101 mmol/L (ref 96–106)
Creatinine, Ser: 1.2 mg/dL — ABNORMAL HIGH (ref 0.57–1.00)
GFR calc Af Amer: 48 mL/min/{1.73_m2} — ABNORMAL LOW (ref >59)
GFR calc non Af Amer: 42 mL/min/{1.73_m2} — ABNORMAL LOW (ref >59)
Glucose: 102 mg/dL — ABNORMAL HIGH (ref 65–99)
Potassium: 3.6 mmol/L (ref 3.5–5.2)
Sodium: 143 mmol/L (ref 134–144)

## 2019-01-31 LAB — MAGNESIUM: Magnesium: 1.5 mg/dL — ABNORMAL LOW (ref 1.6–2.3)

## 2019-01-31 MED ORDER — AMIODARONE HCL 200 MG PO TABS: 400.0000 mg | ORAL_TABLET | Freq: Every day | ORAL | 3 refills | Status: DC

## 2019-01-31 NOTE — Telephone Encounter (Signed)
Patient given detailed instructions per Myocardial Perfusion Study Information Sheet for the test on 11/02/19 at 8:15. Patient notified to arrive 15 minutes early and that it is imperative to arrive on time for appointment to keep from having the test rescheduled.  If you need to cancel or reschedule your appointment, please call the office within 24 hours of your appointment. . Patient verbalized understanding.[name] [name]

## 2019-01-31 NOTE — Progress Notes (Signed)
Cardiology Office Note:    Date:  01/31/2019   ID:  Belkys Henault, DOB 1933-01-08, MRN [id number]  PCP:  Maggie Schwalbe, PA-C  Cardiologist:  Jenean Lindau, MD   Referring MD: Maggie Schwalbe, PA-C    ASSESSMENT:    1. Paroxysmal atrial fibrillation (HCC)   2. Coronary artery disease involving native coronary artery of native heart without angina pectoris    PLAN:    In order of problems listed above:  1. Secondary prevention stressed with the patient.  Importance of compliance with diet and medication stressed and she verbalized understanding. 2. I discussed with the patient atrial fibrillation, disease process. Management and therapy including rate and rhythm control, anticoagulation benefits and potential risks were discussed extensively with the patient. Patient had multiple questions which were answered to patient's satisfaction. 3. The patient is tolerating anticoagulation well.  In view of bradycardia arrhythmias and nonsustained ventricular tachycardia I suggested to the patient that she go on amiodarone.  We will stop metoprolol.  Benefits and potential risks of amiodarone discussed with the patient at extensive length and she vocalized understanding.  We will start her with 400 mg daily for 2 weeks then 200 mg daily for 2 weeks and then 100 mg daily.  She will have blood work including LFTs done today.  She has history of COPD and we will refer her to her pulmonologist to assess the benefits and risks of amiodarone therapy.  If the pulmonologist feels that this is not an appropriate drug we could switch her to other medications.  I discussed this with her at extensive length and she vocalized understanding 4. In view of coronary calcifications found on CT scan and the fact that she has nonsustained ventricular tachycardia we will do a Lexiscan sestamibi. 5. She will be seen in follow-up appointment in a month or earlier if she has any concerns.  She knows to go to the nearest  emergency room for any concerns.   Medication Adjustments/Labs and Tests Ordered: Current medicines are reviewed at length with the patient today.  Concerns regarding medicines are outlined above.  Orders Placed This Encounter  Procedures  . Basic metabolic panel  . Magnesium  . Hepatic function panel  . Ambulatory referral to Pulmonology  . MYOCARDIAL PERFUSION IMAGING   Meds ordered this encounter  Medications  . amiodarone (PACERONE) 200 MG tablet    Sig: Take 2 tablets (400 mg total) by mouth daily. Take 400 mg by mouth once daily for 2 weeks, then to 200 mg once daily for 2 weeks, then 100 mg once daily.    Dispense:  90 tablet    Refill:  3     No chief complaint on file.    History of Present Illness:    [name] [name] is a 85 y.o. female.  Patient has history of paroxysmal atrial fibrillation and is on anticoagulation.  She denies any problems at this time and takes care of activities of daily living.  No chest pain orthopnea or PND.  At the time of my evaluation, the patient is alert awake oriented and in no distress.  Her Holter monitor has revealed significant bradycardia and atrial arrhythmias, atrial fibrillation and nonsustained ventricular tachycardia.  Her family is with her today for this visit.  At the time of my evaluation, the patient is alert awake oriented and in no distress.  Past Medical History:  Diagnosis Date  . Abnormal EKG   . HLD (hyperlipidemia)   . HTN (  hypertension)     Past Surgical History:  Procedure Laterality Date  . CATARACT EXTRACTION  2006  . [place]  . hysterectomy - unknown type  1978    Current Medications: Current Meds  Medication Sig  . diazepam (VALIUM) 2 MG tablet Take 2 mg by mouth as needed.    Marland Kitchen levothyroxine (SYNTHROID, LEVOTHROID) 75 MCG tablet Take 75 mcg by mouth daily.   . montelukast (SINGULAIR) 10 MG tablet Take 1 tablet by mouth daily.  Marland Kitchen omeprazole (PRILOSEC) 20 MG capsule Take 20 mg by mouth 2  (two) times daily.    . Rivaroxaban (XARELTO) 15 MG TABS tablet Take 1 tablet (15 mg total) by mouth daily.  . simvastatin (ZOCOR) 20 MG tablet Take 20 mg by mouth at bedtime.   . [DISCONTINUED] metoprolol tartrate (LOPRESSOR) 50 MG tablet Take 2 tablets (100 mg total) by mouth 2 (two) times daily.     Allergies:   Amoxicillin-pot clavulanate; Codeine; Metoclopramide hcl; Morphine; Nadolol; Sulfamethoxazole-trimethoprim; and Valacyclovir   Social History   Socioeconomic History  . Marital status: Married    Spouse name: Not on file  . Number of children: Not on file  . Years of education: Not on file  . Highest education level: Not on file  Occupational History  . Occupation: Retired  Scientific laboratory technician  . Financial resource strain: Not on file  . Food insecurity:    Worry: Not on file    Inability: Not on file  . Transportation needs:    Medical: Not on file    Non-medical: Not on file  Tobacco Use  . Smoking status: Former Smoker    Last attempt to quit: 05/21/1990    Years since quitting: 28.7  . Smokeless tobacco: Never Used  Substance and Sexual Activity  . Alcohol use: Not on file  . Drug use: No  . Sexual activity: Not on file  Lifestyle  . Physical activity:    Days per week: Not on file    Minutes per session: Not on file  . Stress: Not on file  Relationships  . Social connections:    Talks on phone: Not on file    Gets together: Not on file    Attends religious service: Not on file    Active member of club or organization: Not on file    Attends meetings of clubs or organizations: Not on file    Relationship status: Not on file  Other Topics Concern  . Not on file  Social History Narrative  . Not on file     Family History: The patient's family history includes Aneurysm in her unknown relative; Cancer in her unknown relative.  ROS:   Please see the history of present illness.    All other systems reviewed and are negative.  EKGs/Labs/Other Studies  Reviewed:    The following studies were reviewed today: I discussed my findings with the patient at extensive length.  I obtain chest x-ray and CT scan reports from [facility] and it shows coronary artery calcification suggestive of coronary artery disease.   Recent Labs: 01/18/2019: TSH 2.380  Recent Lipid Panel No results found for: CHOL, TRIG, HDL, CHOLHDL, VLDL, LDLCALC, LDLDIRECT  Physical Exam:    VS:  BP 122/70 (BP Location: Right Arm, Patient Position: Sitting, Cuff Size: Normal)   Pulse (!) 58   Ht 4\' 10"  (1.473 m)   Wt 128 lb (58.1 kg)   SpO2 97%   BMI 26.75 kg/m  Wt Readings from Last 3 Encounters:  01/31/19 128 lb (58.1 kg)  01/03/19 128 lb (58.1 kg)     GEN: Patient is in no acute distress HEENT: Normal NECK: No JVD; No carotid bruits LYMPHATICS: No lymphadenopathy CARDIAC: Hear sounds regular, 2/6 systolic murmur at the apex. RESPIRATORY:  Clear to auscultation without rales, wheezing or rhonchi  ABDOMEN: Soft, non-tender, non-distended MUSCULOSKELETAL:  No edema; No deformity  SKIN: Warm and dry NEUROLOGIC:  Alert and oriented x 3 PSYCHIATRIC:  Normal affect   Signed, Jenean Lindau, MD  01/31/2019 10:01 AM    [place]

## 2019-01-31 NOTE — Telephone Encounter (Signed)
°*  STAT* If patient is at the pharmacy, call can be transferred to refill team.   1. Which medications need to be refilled? (please list name of each medication and dose if known) Xarelto takes 1 daily 2. Which pharmacy/location (including street and city if local pharmacy) is medication to be sent to?[facility] [place]  3. Do they need a 30 day or 90 day supply? [place]

## 2019-01-31 NOTE — Patient Instructions (Signed)
Medication Instructions:   Your physician has recommended you make the following change in your medication:   STOP: Metoprolol 50 mg   START: Amiodarone 400 mg by mouth once daily for 2 weeks, then drop it to 200 mg once daily for 2 weeks, then decrease to 100 mg once daily.    If you need a refill on your cardiac medications before your next appointment, please call your pharmacy.   Lab work: Your physician recommends that you return for lab work today: BMP, LFT, Magnesium   If you have labs (blood work) drawn today and your tests are completely normal, you will receive your results only by: Marland Kitchen MyChart Message (if you have MyChart) OR . A paper copy in the mail If you have any lab test that is abnormal or we need to change your treatment, we will call you to review the results.  Testing/Procedures:  Your physician has requested that you have a lexiscan myoview. For further information please visit [email or web address]. Please follow instruction sheet, as given.    Follow-Up: At [facility], you and your health needs are our priority.  As part of our continuing mission to provide you with exceptional heart care, we have created designated Provider Care Teams.  These Care Teams include your primary Cardiologist (physician) and Advanced Practice Providers (APPs -  Physician Assistants and Nurse Practitioners) who all work together to provide you with the care you need, when you need it.  . You will need a follow up appointment in 1 months.    Any Other Special Instructions Will Be Listed Below (If Applicable).  Dr. Geraldo Pitter has referred to see a pulmonologist. You will be contacted to schedule this appointment.

## 2019-02-01 ENCOUNTER — Telehealth: Payer: Self-pay | Admitting: Cardiology

## 2019-02-01 ENCOUNTER — Other Ambulatory Visit: Payer: Self-pay

## 2019-02-01 ENCOUNTER — Ambulatory Visit (INDEPENDENT_AMBULATORY_CARE_PROVIDER_SITE_OTHER): Payer: Medicare Other

## 2019-02-01 ENCOUNTER — Telehealth: Payer: Self-pay

## 2019-02-01 ENCOUNTER — Telehealth: Payer: Self-pay | Admitting: Emergency Medicine

## 2019-02-01 VITALS — Ht <= 58 in | Wt 128.0 lb

## 2019-02-01 DIAGNOSIS — I480 Paroxysmal atrial fibrillation: Secondary | ICD-10-CM

## 2019-02-01 DIAGNOSIS — I2510 Atherosclerotic heart disease of native coronary artery without angina pectoris: Secondary | ICD-10-CM

## 2019-02-01 LAB — MYOCARDIAL PERFUSION IMAGING
CHL CUP NUCLEAR SSS: 3
LV sys vol: 15 mL
LVDIAVOL: 56 mL (ref 46–106)
Peak HR: 93 {beats}/min
Rest HR: 64 {beats}/min
SDS: 2
SRS: 1
TID: 1

## 2019-02-01 MED ORDER — TECHNETIUM TC 99M TETROFOSMIN IV KIT
10.9000 | PACK | Freq: Once | INTRAVENOUS | Status: AC | PRN
  Administered 2019-02-01: 10.9 via INTRAVENOUS

## 2019-02-01 MED ORDER — RIVAROXABAN 15 MG PO TABS: 15.0000 mg | ORAL_TABLET | Freq: Every day | ORAL | 0 refills | Status: DC

## 2019-02-01 MED ORDER — REGADENOSON 0.4 MG/5ML IV SOLN
0.4000 mg | Freq: Once | INTRAVENOUS | Status: AC
  Administered 2019-02-01: 0.4 mg via INTRAVENOUS

## 2019-02-01 MED ORDER — TECHNETIUM TC 99M TETROFOSMIN IV KIT
31.9000 | PACK | Freq: Once | INTRAVENOUS | Status: AC | PRN
  Administered 2019-02-01: 31.9 via INTRAVENOUS

## 2019-02-01 NOTE — Telephone Encounter (Signed)
Left message for patient to return call regarding results  

## 2019-02-01 NOTE — Telephone Encounter (Signed)
Patient came in and was given had written prescription.

## 2019-02-01 NOTE — Telephone Encounter (Signed)
Patient informed of results and to start mag oxide 200 mg BID. Pt will also return in 1-2 weeks for labs. Pt verbalized understanding.

## 2019-02-01 NOTE — Telephone Encounter (Signed)
-----   Message from Erskine Emery, [facility] sent at 02/01/2019 11:33 AM EST ----- Would recommend Mag oxide 200mg  BID. Recheck Mag level in 1-2 weeks.   Thanks ----- Message ----- From: Orland Penman, CMA Sent: 02/01/2019  10:43 AM EST To: Erskine Emery, RPH, Jenean Lindau, MD  Please Advise : )

## 2019-02-01 NOTE — Telephone Encounter (Signed)
Call Xarelto to [facility] instead of [facility]

## 2019-02-01 NOTE — Telephone Encounter (Signed)
Patient prescription has been sent.

## 2019-02-02 ENCOUNTER — Telehealth: Payer: Self-pay | Admitting: Emergency Medicine

## 2019-02-02 NOTE — Telephone Encounter (Signed)
Informed patient that Dr. Geraldo Pitter is requesting she be seen by EP within a week or two in [place], patient verbally understands and is willing to go to [facility] for the appointment. I have notified Lorenda Hatchet, scheduler for ep and she will move this appointment up.

## 2019-02-03 ENCOUNTER — Inpatient Hospital Stay (HOSPITAL_COMMUNITY)
Admission: EM | Admit: 2019-02-03 | Discharge: 2019-02-09 | DRG: 023 | Disposition: A | Discharge status: Hospice | Payer: Medicare Other | Attending: Neurology | Admitting: Neurology

## 2019-02-03 ENCOUNTER — Inpatient Hospital Stay: Admit: 2019-02-03 | Payer: Medicare Other

## 2019-02-03 ENCOUNTER — Encounter (HOSPITAL_COMMUNITY): Payer: Self-pay | Admitting: Anesthesiology

## 2019-02-03 ENCOUNTER — Encounter (HOSPITAL_COMMUNITY)
Admission: EM | Disposition: A | Discharge status: Hospice | Payer: Self-pay | Source: Home / Self Care | Attending: Neurology

## 2019-02-03 ENCOUNTER — Emergency Department (HOSPITAL_COMMUNITY): Payer: Medicare Other | Admitting: Certified Registered"

## 2019-02-03 ENCOUNTER — Inpatient Hospital Stay (HOSPITAL_COMMUNITY): Payer: Medicare Other

## 2019-02-03 ENCOUNTER — Emergency Department (HOSPITAL_COMMUNITY): Payer: Medicare Other

## 2019-02-03 ENCOUNTER — Other Ambulatory Visit (HOSPITAL_COMMUNITY): Payer: Medicare Other

## 2019-02-03 DIAGNOSIS — Z882 Allergy status to sulfonamides status: Secondary | ICD-10-CM

## 2019-02-03 DIAGNOSIS — E785 Hyperlipidemia, unspecified: Secondary | ICD-10-CM | POA: Diagnosis present

## 2019-02-03 DIAGNOSIS — Z885 Allergy status to narcotic agent status: Secondary | ICD-10-CM

## 2019-02-03 DIAGNOSIS — R402342 Coma scale, best motor response, flexion withdrawal, at arrival to emergency department: Secondary | ICD-10-CM | POA: Diagnosis present

## 2019-02-03 DIAGNOSIS — R4701 Aphasia: Secondary | ICD-10-CM | POA: Diagnosis present

## 2019-02-03 DIAGNOSIS — E440 Moderate protein-calorie malnutrition: Secondary | ICD-10-CM

## 2019-02-03 DIAGNOSIS — R569 Unspecified convulsions: Secondary | ICD-10-CM | POA: Diagnosis not present

## 2019-02-03 DIAGNOSIS — D649 Anemia, unspecified: Secondary | ICD-10-CM | POA: Diagnosis present

## 2019-02-03 DIAGNOSIS — R29710 NIHSS score 10: Secondary | ICD-10-CM | POA: Diagnosis present

## 2019-02-03 DIAGNOSIS — Z79890 Hormone replacement therapy (postmenopausal): Secondary | ICD-10-CM

## 2019-02-03 DIAGNOSIS — I10 Essential (primary) hypertension: Secondary | ICD-10-CM | POA: Diagnosis present

## 2019-02-03 DIAGNOSIS — G8191 Hemiplegia, unspecified affecting right dominant side: Secondary | ICD-10-CM | POA: Diagnosis present

## 2019-02-03 DIAGNOSIS — R29810 Facial weakness: Secondary | ICD-10-CM | POA: Diagnosis present

## 2019-02-03 DIAGNOSIS — R402222 Coma scale, best verbal response, incomprehensible words, at arrival to emergency department: Secondary | ICD-10-CM | POA: Diagnosis present

## 2019-02-03 DIAGNOSIS — J9690 Respiratory failure, unspecified, unspecified whether with hypoxia or hypercapnia: Secondary | ICD-10-CM

## 2019-02-03 DIAGNOSIS — I480 Paroxysmal atrial fibrillation: Secondary | ICD-10-CM | POA: Diagnosis not present

## 2019-02-03 DIAGNOSIS — Z515 Encounter for palliative care: Secondary | ICD-10-CM | POA: Diagnosis present

## 2019-02-03 DIAGNOSIS — I6602 Occlusion and stenosis of left middle cerebral artery: Secondary | ICD-10-CM | POA: Diagnosis present

## 2019-02-03 DIAGNOSIS — Z87891 Personal history of nicotine dependence: Secondary | ICD-10-CM | POA: Diagnosis not present

## 2019-02-03 DIAGNOSIS — Z888 Allergy status to other drugs, medicaments and biological substances status: Secondary | ICD-10-CM

## 2019-02-03 DIAGNOSIS — J9601 Acute respiratory failure with hypoxia: Secondary | ICD-10-CM

## 2019-02-03 DIAGNOSIS — Z7901 Long term (current) use of anticoagulants: Secondary | ICD-10-CM

## 2019-02-03 DIAGNOSIS — Z79899 Other long term (current) drug therapy: Secondary | ICD-10-CM

## 2019-02-03 DIAGNOSIS — R402142 Coma scale, eyes open, spontaneous, at arrival to emergency department: Secondary | ICD-10-CM | POA: Diagnosis present

## 2019-02-03 DIAGNOSIS — I639 Cerebral infarction, unspecified: Secondary | ICD-10-CM | POA: Insufficient documentation

## 2019-02-03 DIAGNOSIS — Z66 Do not resuscitate: Secondary | ICD-10-CM | POA: Diagnosis present

## 2019-02-03 DIAGNOSIS — Z452 Encounter for adjustment and management of vascular access device: Secondary | ICD-10-CM

## 2019-02-03 DIAGNOSIS — Z9911 Dependence on respirator [ventilator] status: Secondary | ICD-10-CM | POA: Diagnosis not present

## 2019-02-03 DIAGNOSIS — J9600 Acute respiratory failure, unspecified whether with hypoxia or hypercapnia: Secondary | ICD-10-CM | POA: Diagnosis not present

## 2019-02-03 DIAGNOSIS — I63412 Cerebral infarction due to embolism of left middle cerebral artery: Secondary | ICD-10-CM | POA: Diagnosis present

## 2019-02-03 DIAGNOSIS — E876 Hypokalemia: Secondary | ICD-10-CM

## 2019-02-03 DIAGNOSIS — R34 Anuria and oliguria: Secondary | ICD-10-CM | POA: Diagnosis not present

## 2019-02-03 DIAGNOSIS — I361 Nonrheumatic tricuspid (valve) insufficiency: Secondary | ICD-10-CM | POA: Diagnosis not present

## 2019-02-03 DIAGNOSIS — Z789 Other specified health status: Secondary | ICD-10-CM

## 2019-02-03 DIAGNOSIS — Z6828 Body mass index (BMI) 28.0-28.9, adult: Secondary | ICD-10-CM

## 2019-02-03 DIAGNOSIS — G9341 Metabolic encephalopathy: Secondary | ICD-10-CM | POA: Diagnosis not present

## 2019-02-03 DIAGNOSIS — I340 Nonrheumatic mitral (valve) insufficiency: Secondary | ICD-10-CM | POA: Diagnosis not present

## 2019-02-03 HISTORY — PX: IR CT HEAD LTD: IMG2386

## 2019-02-03 HISTORY — PX: RADIOLOGY WITH ANESTHESIA: SHX6223

## 2019-02-03 HISTORY — PX: IR PERCUTANEOUS ART THROMBECTOMY/INFUSION INTRACRANIAL INC DIAG ANGIO: IMG6087

## 2019-02-03 LAB — ETHANOL: Alcohol, Ethyl (B): <10 mg/dL (ref <10)

## 2019-02-03 LAB — PROTIME-INR
INR: 1.08
PROTHROMBIN TIME: 13.9 s (ref 11.4–15.2)

## 2019-02-03 LAB — COMPREHENSIVE METABOLIC PANEL
ALT: 10 U/L (ref 0–44)
AST: 20 U/L (ref 15–41)
Albumin: 2.9 g/dL — ABNORMAL LOW (ref 3.5–5.0)
Alkaline Phosphatase: 55 U/L (ref 38–126)
Anion gap: 15 (ref 5–15)
BILIRUBIN TOTAL: 1 mg/dL (ref 0.3–1.2)
BUN: 14 mg/dL (ref 8–23)
CO2: 25 mmol/L (ref 22–32)
Calcium: 8.9 mg/dL (ref 8.9–10.3)
Chloride: 103 mmol/L (ref 98–111)
Creatinine, Ser: 1.18 mg/dL — ABNORMAL HIGH (ref 0.44–1.00)
GFR calc non Af Amer: 43 mL/min — ABNORMAL LOW (ref >60)
GFR, EST AFRICAN AMERICAN: 49 mL/min — AB (ref >60)
Glucose, Bld: 108 mg/dL — ABNORMAL HIGH (ref 70–99)
Potassium: 3 mmol/L — ABNORMAL LOW (ref 3.5–5.1)
Sodium: 143 mmol/L (ref 135–145)
Total Protein: 6.5 g/dL (ref 6.5–8.1)

## 2019-02-03 LAB — CBC
HCT: 39.5 % (ref 36.0–46.0)
Hemoglobin: 12.2 g/dL (ref 12.0–15.0)
MCH: 30.2 pg (ref 26.0–34.0)
MCHC: 30.9 g/dL (ref 30.0–36.0)
MCV: 97.8 fL (ref 80.0–100.0)
Platelets: 182 10*3/uL (ref 150–400)
RBC: 4.04 MIL/uL (ref 3.87–5.11)
RDW: 14.2 % (ref 11.5–15.5)
WBC: 12.3 10*3/uL — ABNORMAL HIGH (ref 4.0–10.5)
nRBC: 0 % (ref 0.0–0.2)

## 2019-02-03 LAB — TROPONIN I: Troponin I: 0.2 ng/mL (ref <0.03)

## 2019-02-03 LAB — DIFFERENTIAL
Abs Immature Granulocytes: 0.07 10*3/uL (ref 0.00–0.07)
Basophils Absolute: 0 10*3/uL (ref 0.0–0.1)
Basophils Relative: 0 %
Eosinophils Absolute: 0.1 10*3/uL (ref 0.0–0.5)
Eosinophils Relative: 1 %
Immature Granulocytes: 1 %
Lymphocytes Relative: 7 %
Lymphs Abs: 0.9 10*3/uL (ref 0.7–4.0)
MONO ABS: 0.9 10*3/uL (ref 0.1–1.0)
Monocytes Relative: 7 %
Neutro Abs: 10.4 10*3/uL — ABNORMAL HIGH (ref 1.7–7.7)
Neutrophils Relative %: 84 %

## 2019-02-03 LAB — I-STAT TROPONIN, ED: Troponin i, poc: 0.59 ng/mL (ref 0.00–0.08)

## 2019-02-03 LAB — APTT: aPTT: 25 seconds (ref 24–36)

## 2019-02-03 LAB — I-STAT CREATININE, ED: Creatinine, Ser: 1 mg/dL (ref 0.44–1.00)

## 2019-02-03 LAB — MRSA PCR SCREENING: MRSA by PCR: NEGATIVE

## 2019-02-03 SURGERY — IR WITH ANESTHESIA
Anesthesia: General

## 2019-02-03 MED ORDER — SENNOSIDES-DOCUSATE SODIUM 8.6-50 MG PO TABS: 1.0000 | ORAL_TABLET | Freq: Every evening | ORAL | Status: DC | PRN

## 2019-02-03 MED ORDER — ACETAMINOPHEN 325 MG PO TABS: 650.0000 mg | ORAL_TABLET | ORAL | Status: DC | PRN

## 2019-02-03 MED ORDER — ACETAMINOPHEN 650 MG RE SUPP: 650.0000 mg | RECTAL | Status: DC | PRN

## 2019-02-03 MED ORDER — EPTIFIBATIDE 20 MG/10ML IV SOLN
INTRAVENOUS | Status: AC
  Filled 2019-02-03: qty 10

## 2019-02-03 MED ORDER — PROPOFOL 10 MG/ML IV BOLUS
INTRAVENOUS | Status: DC | PRN
  Administered 2019-02-03: 100 mg via INTRAVENOUS
  Administered 2019-02-03: 20 mg via INTRAVENOUS

## 2019-02-03 MED ORDER — ACETAMINOPHEN 160 MG/5ML PO SOLN: 650.0000 mg | ORAL | Status: DC | PRN

## 2019-02-03 MED ORDER — SUGAMMADEX SODIUM 200 MG/2ML IV SOLN
INTRAVENOUS | Status: DC | PRN
  Administered 2019-02-03: 200 mg via INTRAVENOUS

## 2019-02-03 MED ORDER — CLEVIDIPINE BUTYRATE 0.5 MG/ML IV EMUL
0.0000 mg/h | INTRAVENOUS | Status: AC
  Administered 2019-02-03: 4 mg/h via INTRAVENOUS
  Administered 2019-02-03: 5 mg/h via INTRAVENOUS
  Administered 2019-02-03: 7 mg/h via INTRAVENOUS
  Administered 2019-02-04: 12 mg/h via INTRAVENOUS
  Administered 2019-02-04: 10 mg/h via INTRAVENOUS
  Filled 2019-02-03 (×5): qty 50

## 2019-02-03 MED ORDER — LIDOCAINE 2% (20 MG/ML) 5 ML SYRINGE
INTRAMUSCULAR | Status: DC | PRN
  Administered 2019-02-03: 40 mg via INTRAVENOUS

## 2019-02-03 MED ORDER — NITROGLYCERIN 1 MG/10 ML FOR IR/CATH LAB
INTRA_ARTERIAL | Status: AC
  Filled 2019-02-03: qty 10

## 2019-02-03 MED ORDER — SODIUM CHLORIDE 0.9 % IV SOLN
INTRAVENOUS | Status: DC | PRN
  Administered 2019-02-03: 50 ug/min via INTRAVENOUS

## 2019-02-03 MED ORDER — TICAGRELOR 90 MG PO TABS
ORAL_TABLET | ORAL | Status: AC
  Filled 2019-02-03: qty 2

## 2019-02-03 MED ORDER — CLEVIDIPINE BUTYRATE 0.5 MG/ML IV EMUL
INTRAVENOUS | Status: AC
  Filled 2019-02-03: qty 50

## 2019-02-03 MED ORDER — LABETALOL HCL 5 MG/ML IV SOLN
INTRAVENOUS | Status: DC | PRN
  Administered 2019-02-03 (×2): 10 mg via INTRAVENOUS

## 2019-02-03 MED ORDER — TIROFIBAN HCL IN NACL 5-0.9 MG/100ML-% IV SOLN
INTRAVENOUS | Status: AC
  Filled 2019-02-03: qty 100

## 2019-02-03 MED ORDER — VANCOMYCIN HCL IN DEXTROSE 1-5 GM/200ML-% IV SOLN
INTRAVENOUS | Status: AC
  Filled 2019-02-03: qty 200

## 2019-02-03 MED ORDER — METOPROLOL TARTRATE 5 MG/5ML IV SOLN
5.0000 mg | INTRAVENOUS | Status: DC | PRN
  Administered 2019-02-04 – 2019-02-05 (×5): 5 mg via INTRAVENOUS
  Filled 2019-02-03 (×4): qty 5

## 2019-02-03 MED ORDER — HEPARIN SODIUM (PORCINE) 5000 UNIT/ML IJ SOLN
5000.0000 [IU] | Freq: Three times a day (TID) | INTRAMUSCULAR | Status: DC
  Administered 2019-02-03 – 2019-02-04 (×2): 5000 [IU] via SUBCUTANEOUS
  Filled 2019-02-03 (×2): qty 1

## 2019-02-03 MED ORDER — LACTATED RINGERS IV SOLN
INTRAVENOUS | Status: DC | PRN
  Administered 2019-02-03: 11:00:00 via INTRAVENOUS

## 2019-02-03 MED ORDER — FENTANYL CITRATE (PF) 100 MCG/2ML IJ SOLN
INTRAMUSCULAR | Status: DC | PRN
  Administered 2019-02-03 (×2): 50 ug via INTRAVENOUS

## 2019-02-03 MED ORDER — SODIUM CHLORIDE 0.9 % IV SOLN
INTRAVENOUS | Status: AC
  Administered 2019-02-03: 18:00:00 via INTRAVENOUS

## 2019-02-03 MED ORDER — ONDANSETRON HCL 4 MG/2ML IJ SOLN
INTRAMUSCULAR | Status: DC | PRN
  Administered 2019-02-03: 4 mg via INTRAVENOUS

## 2019-02-03 MED ORDER — IOHEXOL 300 MG/ML  SOLN
90.0000 mL | Freq: Once | INTRAMUSCULAR | Status: AC | PRN
  Administered 2019-02-03: 90 mL via INTRA_ARTERIAL

## 2019-02-03 MED ORDER — ASPIRIN 81 MG PO CHEW
CHEWABLE_TABLET | ORAL | Status: AC
  Filled 2019-02-03: qty 1

## 2019-02-03 MED ORDER — GLYCOPYRROLATE PF 0.2 MG/ML IJ SOSY
PREFILLED_SYRINGE | INTRAMUSCULAR | Status: DC | PRN
  Administered 2019-02-03: .1 mg via INTRAVENOUS

## 2019-02-03 MED ORDER — STROKE: EARLY STAGES OF RECOVERY BOOK: Freq: Once | Status: DC

## 2019-02-03 MED ORDER — SUCCINYLCHOLINE CHLORIDE 20 MG/ML IJ SOLN
INTRAMUSCULAR | Status: DC | PRN
  Administered 2019-02-03: 100 mg via INTRAVENOUS

## 2019-02-03 MED ORDER — ACETAMINOPHEN 160 MG/5ML PO SOLN
650.0000 mg | ORAL | Status: DC | PRN
  Administered 2019-02-06: 650 mg
  Filled 2019-02-03: qty 20.3

## 2019-02-03 MED ORDER — ROCURONIUM BROMIDE 50 MG/5ML IV SOSY
PREFILLED_SYRINGE | INTRAVENOUS | Status: DC | PRN
  Administered 2019-02-03: 30 mg via INTRAVENOUS

## 2019-02-03 MED ORDER — PHENYLEPHRINE 40 MCG/ML (10ML) SYRINGE FOR IV PUSH (FOR BLOOD PRESSURE SUPPORT)
PREFILLED_SYRINGE | INTRAVENOUS | Status: DC | PRN
  Administered 2019-02-03: 80 ug via INTRAVENOUS
  Administered 2019-02-03 (×3): 40 ug via INTRAVENOUS
  Administered 2019-02-03: 80 ug via INTRAVENOUS
  Administered 2019-02-03: 40 ug via INTRAVENOUS

## 2019-02-03 MED ORDER — SODIUM CHLORIDE (PF) 0.9 % IJ SOLN
INTRAVENOUS | Status: DC | PRN
  Administered 2019-02-03 (×9): 25 ug via INTRA_ARTERIAL

## 2019-02-03 MED ORDER — EPTIFIBATIDE 20 MG/10ML IV SOLN
INTRAVENOUS | Status: DC | PRN
  Administered 2019-02-03 (×2): 1.5 mg via INTRAVENOUS

## 2019-02-03 MED ORDER — SODIUM CHLORIDE 0.9 % IV SOLN
INTRAVENOUS | Status: DC
  Administered 2019-02-04 – 2019-02-07 (×4): via INTRAVENOUS

## 2019-02-03 MED ORDER — CLOPIDOGREL BISULFATE 300 MG PO TABS
ORAL_TABLET | ORAL | Status: AC
  Filled 2019-02-03: qty 1

## 2019-02-03 MED ORDER — CLEVIDIPINE BUTYRATE 0.5 MG/ML IV EMUL
INTRAVENOUS | Status: AC
  Administered 2019-02-03: 4 mg/h via INTRAVENOUS
  Filled 2019-02-03: qty 50

## 2019-02-03 MED ORDER — VANCOMYCIN HCL 1000 MG IV SOLR
INTRAVENOUS | Status: DC | PRN
  Administered 2019-02-03: 1000 mg via INTRAVENOUS

## 2019-02-03 NOTE — Anesthesia Postprocedure Evaluation (Signed)
Anesthesia Post Note  Patient: [name] [name]  Procedure(s) Performed: IR WITH ANESTHESIA (N/A )     Patient location during evaluation: PACU Anesthesia Type: General Level of consciousness: awake and alert Pain management: pain level controlled Vital Signs Assessment: post-procedure vital signs reviewed and stable Respiratory status: spontaneous breathing, nonlabored ventilation, respiratory function stable and patient connected to nasal cannula oxygen Cardiovascular status: blood pressure returned to baseline and stable Postop Assessment: no apparent nausea or vomiting Anesthetic complications: no Comments: Still apparently with expressive aphasia.    Last Vitals:  Vitals:   02/03/19 1400  Temp: (!) 36.3 C    Last Pain: There were no vitals filed for this visit.               Caulin Begley A.

## 2019-02-03 NOTE — H&P (Signed)
Chief Complaint: Aphasia  History obtained from: Patient and Chart    HPI:                                                                                                                                       [name] [name] is an 85 y.o. female with past medical history of hypertension, hyperlipidemia, atrial fibrillation on Xarelto presents to [facility] as a code stroke.  Last seen normal was 8:00 this morning patient was talking with her husband, and suddenly patient had trouble getting her words out.  EMS was called and noted to be aphasic and had right-sided weakness.  The patient was taken to [facility] emergency room where she was stroke alerted.  She was evaluated by tele-neurology, she did not receive IV TPA as she was already on Xarelto.  CT head was negative for hemorrhage showed aspects of 8.  CT angiogram showed a left M2 occlusion and was contacted about the results.  Spoke with [facility] ER physician and recommended transfer to [facility] for emergent mechanical thrombectomy.  On arrival, patient had initial NIH stroke scale of 10 for aphasia, right facial droop and right hemiparesis.  Patient was taken immediately to IR  Date last known well: 2.14-20 Time last known well: 8 am  tPA Given:  NIHSS:  Baseline MRS   Past Medical History:  Diagnosis Date  . Abnormal EKG   . HLD (hyperlipidemia)   . HTN (hypertension)     Past Surgical History:  Procedure Laterality Date  . CATARACT EXTRACTION  2006  . HEMORRHOID SURGERY  1971  . hysterectomy - unknown type  1978    Family History  Problem Relation Age of Onset  . Aneurysm Unknown   . Cancer Unknown    Social History:  reports that she quit smoking about 28 years ago. She has never used smokeless tobacco. She reports that she does not use drugs. No history on file for alcohol.  Allergies:  Allergies  Allergen Reactions  . Amoxicillin-Pot Clavulanate     REACTION: Reaction not known  . Codeine      REACTION: Reaction not known  . Metoclopramide Hcl     REACTION: Reaction not known  . Morphine     REACTION: Reaction not known  . Nadolol Other (See Comments)    UNKNOWN  . Sulfamethoxazole-Trimethoprim Diarrhea  . Valacyclovir Diarrhea    Medications:  I reviewed home medications   ROS:                                                                                                                                     14 systems reviewed and negative except above    Examination:                                                                                                      General: Appears well-developed Psych: Affect appropriate to situation Eyes: No scleral injection HENT: No OP obstrucion Head: Normocephalic.  Cardiovascular: Normal rate and regular rhythm.  Respiratory: Effort normal and breath sounds normal to anterior ascultation GI: Soft.  No distension. There is no tenderness.  Skin: WDI    Neurological Examination Mental Status: Awake, aphasic. Follow commands intermittently  Cranial Nerves: II: Visual fields grossly normal,  III,IV, VI: ptosis not present, extra-ocular motions intact bilaterally, pupils equal, round, reactive to light and accommodation V,VII: right facial droop VIII: hearing normal bilaterally IX,X: uvula rises symmetrically XI: bilateral shoulder shrug XII: midline tongue extension Motor: Right : Upper extremity   3/5    Left:     Upper extremity   5/5  Lower extremity   4/5     Lower extremity   5/5 Tone and bulk:normal tone throughout; no atrophy noted Sensory: difficult to assess due to aphasia  Deep Tendon Reflexes: 2+ and symmetric throughout Plantars: Right: downgoing   Left: downgoing Cerebellar: Not assessed  Gait: did not assess     Lab Results: Basic Metabolic Panel: Recent Labs  Lab  01/31/19 0951 02/03/19 1104  NA 143  --   K 3.6  --   CL 101  --   CO2 26  --   GLUCOSE 102*  --   BUN 21  --   CREATININE 1.20* 1.00  CALCIUM 8.9  --   MG 1.5*  --     CBC: Recent Labs  Lab 02/03/19 1122  WBC 12.3*  NEUTROABS 10.4*  HGB 12.2  HCT 39.5  MCV 97.8  PLT 182    Coagulation Studies: Recent Labs    02/03/19 1122  LABPROT 13.9  INR 1.08    Imaging: No results found.   ASSESSMENT AND PLAN   Left MCA stroke status post mechanical thrombectomy with TICI 2B recanalization Risk factors:  hypertension, hyperlipidemia, A. fib Etiology: Cardioembolic  Recommend # MRI of the brain without contrast #Hold anticoagulation until MRI brain to assess size of stroke,  hemorrhage  #Transthoracic Echo  #Start or continue Atorvastatin 80 mg/other high intensity statin when patient passes swallow evaluation  # BP goal: 478- 412 systolic BP # Telemetry monitoring # Frequent neuro checks # stroke swallow screen  HTN # BP goal: 820- 813 systolic BP s/p MT  Afib with RVR - hold AC for now - PRN metoprolol 5mg  IV for HR >130  - resume amiodarone once she passes swallow eval  Elevated troponemia - will continue to trend - EKG - cardiology consulted, paged and spoke to [place] #Start or continue Crestor home medication  when patient passes swallow eval #Lipid profile   Please page stroke NP  Or  PA  Or MD from 8am -4 pm  as this patient from this time will be  followed by the stroke.   You can look them up on [email or web address]  Password [email or web address]   This patient is neurologically critically ill due to left mca stroke s/p MT.  She is at risk for significant risk of neurological worsening from cerebral edema,  death from brain herniation, heart failure, hemorrhagic conversion, infection, respiratory failure and seizure. This patient's care requires constant monitoring of vital signs, hemodynamics, respiratory and cardiac monitoring, review of multiple databases,  neurological assessment, discussion with family, other specialists and medical decision making of high complexity.  I spent 60  minutes of neurocritical time in the care of this patient.      Sushanth Aroor [facility] Pager Number [phone]

## 2019-02-03 NOTE — Progress Notes (Signed)
Patient ID: [name] [name], female   DOB: 1933-11-27, 85 y.o.   MRN: [id number] INR  Post procedure Ct brain  No ICH.No mass effect or shift. Pupils 79mm Rt = LT .intermittently moves both feet, and raises her Rt hand. RT groin soft.No hematoma.Groin  sheath to arterial flush.Distal pulses dopplerable unchanged. S.Dsean Vantol MD

## 2019-02-03 NOTE — Procedures (Signed)
S/P Lt common carotid arterriogram followed by complete revascularization of occlude dominant inf division with x 2 passes with 39mm x 79mm embotrap retriever dice achieving a TICI 2 b revascularization

## 2019-02-03 NOTE — Code Documentation (Addendum)
85 yo female coming from [place] ED with complaints of sudden onset of aphasia that started this morning. Pt was talking normally with her husband at 0800 this morning when her daughter called. During their conversation, pt had a sudden onset of change in speech. Daughter called EMS. When EMS arrived, Pt had right sided weakness and aphasia.went to talk with daughter and couldn't get her words out. Pt taken to [facility] ED where she was evaluated as a Code Stroke. Not a tPA candidate due to being on Xarelto - last taken yesterday. CTA completed and noted having left M2 occlusion. Radiology called Neurologist at [facility] and patient accepted for transfer for IR. When patient arrived, pt initial NIHSS 10 due to slight right facial droop, right arm and leg weakness, and some expressive and receptive aphasia. Labs obtained upon arrival. Pt taken directly to IR. Arrived to IR at 1100. Handoff given to Donnelly Stager RN.

## 2019-02-03 NOTE — Progress Notes (Signed)
Istat trop .35 reported from CRNA upon PACU admission. Istat drawn in interventional radiology. Dr Aroor notified of critical value. Stated he would consult cardiology. No further orders received at this time.

## 2019-02-03 NOTE — Anesthesia Procedure Notes (Signed)
Arterial Line Insertion Start/End2/14/2020 11:15 AM, 02/03/2019 11:20 AM Performed by: Moshe Salisbury, CRNA, CRNA  Patient location: OOR procedure area. Preanesthetic checklist: patient identified, IV checked, site marked, risks and benefits discussed, surgical consent, monitors and equipment checked, pre-op evaluation, timeout performed and anesthesia consent Left, radial was placed Catheter size: 20 G Hand hygiene performed , maximum sterile barriers used  and Seldinger technique used  Attempts: 2 Procedure performed without using ultrasound guided technique. Following insertion, dressing applied and Biopatch. Post procedure assessment: normal  Patient tolerated the procedure well with no immediate complications.

## 2019-02-03 NOTE — Transfer of Care (Signed)
Immediate Anesthesia Transfer of Care Note  Patient: [name] [name]  Procedure(s) Performed: IR WITH ANESTHESIA (N/A )  Patient Location: PACU  Anesthesia Type:General  Level of Consciousness: awake and patient cooperative  Airway & Oxygen Therapy: Patient Spontanous Breathing and Patient connected to nasal cannula oxygen  Post-op Assessment: Report given to RN, Post -op Vital signs reviewed and stable and Patient moving all extremities  Post vital signs: Reviewed and stable  Last Vitals:  Vitals Value Taken Time  BP 128/59 02/03/2019  4:50 PM  Temp    Pulse 83 02/03/2019  4:52 PM  Resp 15 02/03/2019  4:53 PM  SpO2 100 % 02/03/2019  4:52 PM  Vitals shown include unvalidated device data.  Last Pain: There were no vitals filed for this visit.       Complications: No apparent anesthesia complications

## 2019-02-03 NOTE — Progress Notes (Signed)
Troponin of 0.2 called on the patient. MD notified.

## 2019-02-03 NOTE — Progress Notes (Signed)
Saw patient in PACU following procedure. Patient underwent an image-guided cerebral arteriogram with emergent mechanical thrombectomy of left MCA occlusion achieving a TICI 2b revascularization today by Dr. Estanislado Pandy.  Patient awake and alert laying in bed. She is grossly aphasic and does not follow commands. Can bend bilateral upper extremities and lift LLE off bed, has some spontaneous movements of RLE but is weaker on RLE compared to LLE. Right groin incision soft with sheath intact, no active bleeding or hematoma. Distal pulses 1+ bilaterally.  Plan to transfer to 4N pending bed assignment. Right groin sheath to be removed tomorrow AM- post sheath removal orders signed and held. Appreciate and agree with neurology management. IR to follow.  [name] Graff [place], PA-C 02/03/2019, 3:53 PM

## 2019-02-03 NOTE — Anesthesia Preprocedure Evaluation (Addendum)
Anesthesia Evaluation  Patient identified by MRN, date of birth, ID band Patient awake    Reviewed: Allergy & Precautions, NPO status , Patient's Chart, lab work & pertinent test results  Airway Mallampati: II  TM Distance: >3 FB Neck ROM: Full    Dental no notable dental hx. (+) Teeth Intact, Dental Advisory Given   Pulmonary COPD, former smoker,    Pulmonary exam normal breath sounds clear to auscultation       Cardiovascular hypertension, Pt. on medications + CAD  + dysrhythmias Atrial Fibrillation  Rhythm:Irregular Rate:Tachycardia     Neuro/Psych PSYCHIATRIC DISORDERS Anxiety Expressive aphasia  Neuromuscular disease CVA, Residual Symptoms    GI/Hepatic GERD  Medicated and Controlled,  Endo/Other  Hypothyroidism Hyperlipidemia  Renal/GU Renal InsufficiencyRenal disease  negative genitourinary   Musculoskeletal  (+) Arthritis , Osteoarthritis,  DDD lumbar spine   Abdominal   Peds  Hematology  (+) anemia , Xarelto therapy   Anesthesia Other Findings   Reproductive/Obstetrics                            Anesthesia Physical Anesthesia Plan  ASA: IV  Anesthesia Plan: General   Post-op Pain Management:    Induction: Intravenous  PONV Risk Score and Plan: 3 and Ondansetron and Treatment may vary due to age or medical condition  Airway Management Planned: Oral ETT  Additional Equipment:   Intra-op Plan:   Post-operative Plan: Extubation in OR  Informed Consent: I have reviewed the patients History and Physical, chart, labs and discussed the procedure including the risks, benefits and alternatives for the proposed anesthesia with the patient or authorized representative who has indicated his/her understanding and acceptance.     Dental advisory given  Plan Discussed with: CRNA and Surgeon  Anesthesia Plan Comments:         Anesthesia Quick Evaluation

## 2019-02-03 NOTE — Anesthesia Procedure Notes (Signed)
Procedure Name: Intubation Date/Time: 02/03/2019 11:10 AM Performed by: Moshe Salisbury, CRNA Pre-anesthesia Checklist: Patient identified, Emergency Drugs available, Suction available and Patient being monitored Patient Re-evaluated:Patient Re-evaluated prior to induction Oxygen Delivery Method: Circle system utilized Induction Type: IV induction, Rapid sequence and Cricoid Pressure applied Laryngoscope Size: Miller and 2 Grade View: Grade I Tube type: Oral Tube size: 7.5 mm Number of attempts: 1 Airway Equipment and Method: Stylet Placement Confirmation: ETT inserted through vocal cords under direct vision,  positive ETCO2 and breath sounds checked- equal and bilateral Secured at: 21 cm Tube secured with: Tape Dental Injury: Teeth and Oropharynx as per pre-operative assessment  Comments: Intubation by [place] CRNA

## 2019-02-03 NOTE — Progress Notes (Signed)
Patient ID: [name] [name], female   DOB: 12-Jan-1933, 85 y.o.   MRN: [id number] INR 85 F RH LSW 8.00am MRSS 0 to1. Acute aphasia and rt sided weakness. CT brain no ICH.ASPECTS 10. CTA occluded dominant inf division of Lt MCA. On Xarelto. NIHSS10 Endovascular revascularization option D/W husband. Reasons,procedure,alrenativesD/W spouse.risks of ICH 10 %,worsening neuro deficit,vent dependency,death,inability to revascularize,and vascular injury reviewed.Spouse expressed understanding and agreed to proceed with the treatment.. Informed witnessed consent obtained.Marland Kitchen S.Marlyn Tondreau MSD

## 2019-02-04 ENCOUNTER — Inpatient Hospital Stay (HOSPITAL_COMMUNITY): Payer: Medicare Other

## 2019-02-04 DIAGNOSIS — I361 Nonrheumatic tricuspid (valve) insufficiency: Secondary | ICD-10-CM

## 2019-02-04 DIAGNOSIS — I480 Paroxysmal atrial fibrillation: Secondary | ICD-10-CM

## 2019-02-04 DIAGNOSIS — I340 Nonrheumatic mitral (valve) insufficiency: Secondary | ICD-10-CM

## 2019-02-04 LAB — RAPID URINE DRUG SCREEN, HOSP PERFORMED
Amphetamines: NOT DETECTED
Barbiturates: NOT DETECTED
Benzodiazepines: NOT DETECTED
Cocaine: NOT DETECTED
Opiates: NOT DETECTED
Tetrahydrocannabinol: NOT DETECTED

## 2019-02-04 LAB — CBC WITH DIFFERENTIAL/PLATELET
Abs Immature Granulocytes: 0.07 10*3/uL (ref 0.00–0.07)
Basophils Absolute: 0 10*3/uL (ref 0.0–0.1)
Basophils Relative: 0 %
Eosinophils Absolute: 0 10*3/uL (ref 0.0–0.5)
Eosinophils Relative: 0 %
HCT: 30.8 % — ABNORMAL LOW (ref 36.0–46.0)
Hemoglobin: 9.9 g/dL — ABNORMAL LOW (ref 12.0–15.0)
Immature Granulocytes: 0 %
Lymphocytes Relative: 3 %
Lymphs Abs: 0.5 10*3/uL — ABNORMAL LOW (ref 0.7–4.0)
MCH: 31.3 pg (ref 26.0–34.0)
MCHC: 32.1 g/dL (ref 30.0–36.0)
MCV: 97.5 fL (ref 80.0–100.0)
Monocytes Absolute: 0.9 10*3/uL (ref 0.1–1.0)
Monocytes Relative: 5 %
Neutro Abs: 15.2 10*3/uL — ABNORMAL HIGH (ref 1.7–7.7)
Neutrophils Relative %: 92 %
Platelets: 179 10*3/uL (ref 150–400)
RBC: 3.16 MIL/uL — ABNORMAL LOW (ref 3.87–5.11)
RDW: 14.5 % (ref 11.5–15.5)
WBC: 16.7 10*3/uL — ABNORMAL HIGH (ref 4.0–10.5)
nRBC: 0 % (ref 0.0–0.2)

## 2019-02-04 LAB — BASIC METABOLIC PANEL WITH GFR
Anion gap: 13 (ref 5–15)
BUN: 12 mg/dL (ref 8–23)
CO2: 20 mmol/L — ABNORMAL LOW (ref 22–32)
Calcium: 8.1 mg/dL — ABNORMAL LOW (ref 8.9–10.3)
Chloride: 110 mmol/L (ref 98–111)
Creatinine, Ser: 1.28 mg/dL — ABNORMAL HIGH (ref 0.44–1.00)
GFR calc Af Amer: 45 mL/min — ABNORMAL LOW
GFR calc non Af Amer: 39 mL/min — ABNORMAL LOW
Glucose, Bld: 133 mg/dL — ABNORMAL HIGH (ref 70–99)
Potassium: 2.9 mmol/L — ABNORMAL LOW (ref 3.5–5.1)
Sodium: 143 mmol/L (ref 135–145)

## 2019-02-04 LAB — LIPID PANEL
Cholesterol: 108 mg/dL (ref 0–200)
HDL: 30 mg/dL — ABNORMAL LOW
LDL Cholesterol: 63 mg/dL (ref 0–99)
Total CHOL/HDL Ratio: 3.6 ratio
Triglycerides: 77 mg/dL
VLDL: 15 mg/dL (ref 0–40)

## 2019-02-04 LAB — TROPONIN I
Troponin I: 0.17 ng/mL
Troponin I: 0.15 ng/mL (ref <0.03)
Troponin I: 0.13 ng/mL (ref <0.03)

## 2019-02-04 LAB — URINALYSIS, ROUTINE W REFLEX MICROSCOPIC
Bacteria, UA: NONE SEEN
Bilirubin Urine: NEGATIVE
Glucose, UA: NEGATIVE mg/dL
Ketones, ur: NEGATIVE mg/dL
Leukocytes,Ua: NEGATIVE
Nitrite: NEGATIVE
PROTEIN: 100 mg/dL — AB
Specific Gravity, Urine: 1.043 — ABNORMAL HIGH (ref 1.005–1.030)
pH: 7 (ref 5.0–8.0)

## 2019-02-04 LAB — ECHOCARDIOGRAM COMPLETE

## 2019-02-04 MED ORDER — LORAZEPAM 2 MG/ML IJ SOLN
1.0000 mg | INTRAMUSCULAR | Status: AC
  Administered 2019-02-04: 1 mg via INTRAVENOUS

## 2019-02-04 MED ORDER — SODIUM CHLORIDE 0.9 % IV SOLN: INTRAVENOUS | Status: DC

## 2019-02-04 MED ORDER — METOPROLOL TARTRATE 25 MG PO TABS
25.0000 mg | ORAL_TABLET | Freq: Three times a day (TID) | ORAL | Status: DC
  Administered 2019-02-04 – 2019-02-07 (×9): 25 mg
  Filled 2019-02-04 (×9): qty 1

## 2019-02-04 MED ORDER — POTASSIUM CHLORIDE 10 MEQ/100ML IV SOLN
10.0000 meq | INTRAVENOUS | Status: AC
  Administered 2019-02-04 (×4): 10 meq via INTRAVENOUS
  Filled 2019-02-04 (×4): qty 100

## 2019-02-04 MED ORDER — DILTIAZEM HCL-DEXTROSE 100-5 MG/100ML-% IV SOLN (PREMIX)
5.0000 mg/h | INTRAVENOUS | Status: DC
  Administered 2019-02-04: 5 mg/h via INTRAVENOUS
  Administered 2019-02-04: 10 mg/h via INTRAVENOUS
  Administered 2019-02-05: 12.5 mg/h via INTRAVENOUS
  Administered 2019-02-05: 7.5 mg/h via INTRAVENOUS
  Filled 2019-02-04 (×5): qty 100

## 2019-02-04 MED ORDER — PHENYLEPHRINE HCL-NACL 10-0.9 MG/250ML-% IV SOLN
INTRAVENOUS | Status: AC
  Filled 2019-02-04: qty 250

## 2019-02-04 MED ORDER — LIDOCAINE HCL (CARDIAC) PF 100 MG/5ML IV SOSY
PREFILLED_SYRINGE | INTRAVENOUS | Status: AC
  Filled 2019-02-04: qty 5

## 2019-02-04 MED ORDER — METOPROLOL TARTRATE 5 MG/5ML IV SOLN
INTRAVENOUS | Status: AC
  Filled 2019-02-04: qty 5

## 2019-02-04 MED ORDER — CHLORHEXIDINE GLUCONATE 0.12 % MT SOLN
15.0000 mL | Freq: Two times a day (BID) | OROMUCOSAL | Status: DC
  Administered 2019-02-04 – 2019-02-05 (×4): 15 mL via OROMUCOSAL
  Filled 2019-02-04: qty 15

## 2019-02-04 MED ORDER — MIDAZOLAM HCL 2 MG/2ML IJ SOLN
INTRAMUSCULAR | Status: AC
  Administered 2019-02-05: 0.5 mg
  Filled 2019-02-04: qty 2

## 2019-02-04 MED ORDER — ORAL CARE MOUTH RINSE
15.0000 mL | Freq: Two times a day (BID) | OROMUCOSAL | Status: DC
  Administered 2019-02-04 – 2019-02-08 (×8): 15 mL via OROMUCOSAL

## 2019-02-04 MED ORDER — HEPARIN (PORCINE) 25000 UT/250ML-% IV SOLN
750.0000 [IU]/h | INTRAVENOUS | Status: DC
  Administered 2019-02-04: 800 [IU]/h via INTRAVENOUS
  Administered 2019-02-05 – 2019-02-07 (×2): 750 [IU]/h via INTRAVENOUS
  Filled 2019-02-04 (×3): qty 250

## 2019-02-04 MED ORDER — LORAZEPAM 2 MG/ML IJ SOLN
INTRAMUSCULAR | Status: AC
  Filled 2019-02-04: qty 1

## 2019-02-04 MED ORDER — LEVETIRACETAM IN NACL 1000 MG/100ML IV SOLN
1000.0000 mg | INTRAVENOUS | Status: AC
  Administered 2019-02-04: 1000 mg via INTRAVENOUS
  Filled 2019-02-04: qty 100

## 2019-02-04 NOTE — Procedures (Signed)
Cortrak  Person Inserting Tube:  [name] [name], [name] Tube Type:  Cortrak - 43 inches Tube Location:  Right nare Initial Placement:  Stomach Secured by: Bridle Technique Used to Measure Tube Placement:  Documented cm marking at nare/ corner of mouth Cortrak Secured At:  65 cm    Cortrak Tube Team Note:  Cortrak [name] contacted d/t tube not flushing, thought to be coiled.   Reinserted stylet and drew tube back 12 cm. Flushed. Per cortrak monitor, tube tip still terminating in distal stomach/pyloric channel.    No x-ray is required. RN may begin using tube.   If the tube becomes dislodged please keep the tube and contact the Cortrak team at [email or web address] (password [email or web address]) for replacement.  If after hours and replacement cannot be delayed, place [name] NG tube and confirm placement with an abdominal x-ray.    [name] [name], [name] [name], [name] [name] Clinical Nutrition Available Tues-Sat via Pager: [phone] 02/04/2019 3:27 PM

## 2019-02-04 NOTE — Progress Notes (Signed)
ANTICOAGULATION CONSULT NOTE - Initial Consult  Pharmacy Consult for heparin Indication: atrial fibrillation   Patient Measurements: Heparin Dosing Weight: 58 kg  Vital Signs: Temp: 99 F (37.2 C) (02/15 0800) Temp Source: Axillary (02/15 0800) BP: 124/76 (02/15 0930) Pulse Rate: 127 (02/15 0930)  Labs: Recent Labs    02/03/19 1104 02/03/19 1122 02/03/19 2208 02/04/19 0408  HGB  --  12.2  --  9.9*  HCT  --  39.5  --  30.8*  PLT  --  182  --  179  APTT  --  25  --   --   LABPROT  --  13.9  --   --   INR  --  1.08  --   --   CREATININE 1.00 1.18*  --  1.28*  TROPONINI  --   --  0.20* 0.17*     Medical History: Past Medical History:  Diagnosis Date  . Abnormal EKG   . HLD (hyperlipidemia)   . HTN (hypertension)     Assessment: Patient with known AFib, on Xarelto prior to admission, admitted with acute MCA stroke, unfortunately, appears to have failed Xarelto. She is s/p mechanical thrombectomy. Planning to start patient on heparin infusion until plan for oral anticoagulation has been decided. She did receive heparin subQ 5000 units this morning. Hgb down slightly from admission 12.2 > 9.9, plts wnl.   Goal of Therapy:  Heparin level 0.3-0.7 units/ml Monitor platelets by anticoagulation protocol: Yes    Plan:  -Heparin infusion at 800 units/hr -Daily HL, CBC -Check level in 8 hours   Harvel Quale 02/04/2019,10:10 AM

## 2019-02-04 NOTE — Progress Notes (Signed)
SLP Cancellation Note  Patient Details Name: [name] [name] MRN: [id number] DOB: Jan 27, 1933   Cancelled treatment:       Reason Eval/Treat Not Completed: Medical issues which prohibited therapy. Pt to remain flat for 4 hours; will defer at request of RN. No [facility] Swallow Screen completed yet; RN will place swallow orders if/when appropriate.  Deneise Lever, [place], CCC-SLP Speech-Language Pathologist Acute Rehabilitation Services Pager: [phone] Office: [phone]    Aliene Altes 02/04/2019, 9:40 AM

## 2019-02-04 NOTE — Progress Notes (Signed)
STROKE TEAM PROGRESS NOTE   HISTORY OF PRESENT ILLNESS (per record) [name] [name] is an 85 y.o. female with past medical history of hypertension, hyperlipidemia, atrial fibrillation on Xarelto presents to [facility] as a code stroke.  Last seen normal was 8:00 this morning patient was talking with her husband, and suddenly patient had trouble getting her words out.  EMS was called and noted to be aphasic and had right-sided weakness.  The patient was taken to [facility] emergency room where she was stroke alerted.  She was evaluated by tele-neurology, she did not receive IV TPA as she was already on Xarelto.  CT head was negative for hemorrhage showed aspects of 8.  CT angiogram showed a left M2 occlusion and was contacted about the results.  Spoke with [facility] ER physician and recommended transfer to [facility] for emergent mechanical thrombectomy.  On arrival, patient had initial NIH stroke scale of 10 for aphasia, right facial droop and right hemiparesis. Patient was taken immediately to IR  Date last known well: 2.14-20 Time last known well: 8 am  tPA Given:  NIHSS:  Baseline MRS   Cerebral Angiogram and Intervention S/P Lt common carotid arterriogram followed by complete revascularization of occluded dominant inf division with x 2 passes with 35mm x 72mm embotrap retriever dice achieving a TICI 2 b revascularization   SUBJECTIVE (INTERVAL HISTORY) Pt's daughter and husband present. Pt is aphasic. Dr Leonie Man discussed details of current illness.MRI scan shows moderate sized acute nonhemorrhagic left MCA infarct with a few additional tiny infarcts involving mesial left temporal and left occipital pole. 6 mm additional small infarct in right cerebellum.    OBJECTIVE Vitals:   02/04/19 1030 02/04/19 1045 02/04/19 1100 02/04/19 1115  BP: 134/77 103/61 120/77 124/84  Pulse: (!) 126 (!) 139 70 (!) 124  Resp: (!) 23 (!) 7 (!) 22 19  Temp:      TempSrc:      SpO2: 99%  (!) 81% 94% 97%    CBC:  Recent Labs  Lab 02/03/19 1122 02/04/19 0408  WBC 12.3* 16.7*  NEUTROABS 10.4* 15.2*  HGB 12.2 9.9*  HCT 39.5 30.8*  MCV 97.8 97.5  PLT 182 621    Basic Metabolic Panel:  Recent Labs  Lab 01/31/19 0951  02/03/19 1122 02/04/19 0408  NA 143  --  143 143  K 3.6  --  3.0* 2.9*  CL 101  --  103 110  CO2 26  --  25 20*  GLUCOSE 102*  --  108* 133*  BUN 21  --  14 12  CREATININE 1.20*   < > 1.18* 1.28*  CALCIUM 8.9  --  8.9 8.1*  MG 1.5*  --   --   --    < > = values in this interval not displayed.    Lipid Panel: No results found for: CHOL, TRIG, HDL, CHOLHDL, VLDL, LDLCALC HgbA1c: No results found for: HGBA1C Urine Drug Screen:     Component Value Date/Time   LABOPIA NONE DETECTED 02/04/2019 0012   COCAINSCRNUR NONE DETECTED 02/04/2019 0012   LABBENZ NONE DETECTED 02/04/2019 0012   AMPHETMU NONE DETECTED 02/04/2019 0012   THCU NONE DETECTED 02/04/2019 0012   LABBARB NONE DETECTED 02/04/2019 0012    Alcohol Level     Component Value Date/Time   ETH <10 02/03/2019 1122    IMAGING  Dg Chest 1 View 02/03/2019  IMPRESSION:  No active disease.    Mr Brain Wo Contrast 02/04/2019 IMPRESSION:  1. Moderate-sized acute nonhemorrhagic ischemic anterior left MCA territory infarct.  2. Few additional small volume cortical infarcts involving the mesial left temporal lobe and left occipital pole, likely related to fetal type left PCA seen on prior CTA.  3. Additional 6 mm acute ischemic nonhemorrhagic right cerebellar infarct.  4. Focus of susceptibility artifact at the base of the left sylvian fissure, suspected to reflect residual and/or recurrent thrombus within a proximal left M2 branch.  5. Underlying atrophy with advanced chronic microvascular ischemic disease.    Cerebral Angiogram and Intervention S/P Lt common carotid arterriogram followed by complete revascularization of occluded dominant inf division with x 2 passes with 81mm x 63mm  embotrap retriever dice achieving a TICI 2 b revascularization   Transthoracic Echocardiogram  00/00/00 Pending    PHYSICAL EXAM Blood pressure 124/84, pulse (!) 124, temperature 99 F (37.2 C), temperature source Axillary, resp. rate 19, SpO2 97 %. Elderly Caucasian lady who is not in distress. . Afebrile. Head is nontraumatic. Neck is supple without bruit.    Cardiac exam no murmur or gallop. Lungs are clear to auscultation. Distal pulses are well felt.  Neurological Exam :  Awake alert globally aphasic and does not follow commands. She does track visually. She makes some guttural sounds but speech cannot be understood. She has left gaze preference but will follow gaze to the right past midline. She blinks to threat on the left but less on the right. Right lower facial weakness. Tongue midline. Motor system exam no upper or lower extremity drift symmetric and equal strength against gravity in all 4 extremities. Deep tendon reflexes symmetric. Sensation appears intact bilaterally. Plantars downgoing. Gait not tested.     ASSESSMENT/PLAN [name] is a 85 y.o. female with history of hypertension, hyperlipidemia, atrial fibrillation on Xarelto presenting with aphasia, right facial droop and right hemiparesis. She did not receive IV t-PA due to anticoagulation. M2 occlusion with mechanical thrombectomy in IR.  Stroke:  Multiple infarcts including anterior left MCA territory - embolic - history of remote afib.  Resultant  aphasia  CT head - OSH  MRI head - Moderate-sized acute nonhemorrhagic ischemic anterior left MCA territory infarct. Few additional small volume cortical infarcts involving the mesial left temporal lobe and left occipital pole, likely related to fetal type left PCA seen on prior CTA. Additional 6 mm acute ischemic nonhemorrhagic right cerebellar infarct.   MRA head - not performed  CTA H&N - OSH - left M2 occlusion  Carotid Doppler - cerebral angiogram  2D Echo  - ejection fraction greater than 65%. No wall motion abnormalities.  LDL - 63 mg percent  HgbA1c - pending  UDS - negative  VTE prophylaxis - SCDs and IV heparin  Diet - NPO  Xarelto (rivaroxaban) daily prior to admission, now on heparin IV  Patient counseled to be compliant with her antithrombotic medications  Ongoing aggressive stroke risk factor management  Therapy recommendations:  pending  Disposition:  Pending  Hypertension  Stable . Permissive hypertension (OK if < 220/120) but gradually normalize in 5-7 days . Long-term BP goal normotensive  Hyperlipidemia  Lipid lowering medication PTA: Zocor 20 mg daily  LDL pending, goal < 70  Current lipid lowering medication: none (await LDL)  Continue statin at discharge   Other Stroke Risk Factors  Advanced age  Former cigarette smoker - quit  Atrial fibrillation    Other Active Problems  Hypokalemia - supplement  Hypomagnesemia - recheck in AM  Atrial fibrillation - IV heparin -  Cardiology consult for rate control  Anemia - monitor - Hb 9.9  Leukocytosis - 16.7 (temp 99) (U/A and CXR not C/W infection) monitor  Creatinine - 1.28  Elevated troponin enzymes - trending down - may be due to RVR - continue to monitor - may need cardiology to address (on IV heparin)  IR sheath removed 9:45 AM today   Hospital day # [place] PA-C [facility] Pager [phone] 02/04/2019, 12:16 PM I have personally obtained history,examined this patient, reviewed notes, independently viewed imaging studies, participated in medical decision making and plan of care.ROS completed by me personally and pertinent positives fully documented  I have made any additions or clarifications directly to the above note. Agree with note above. She presented with aphasia due to left M2 occlusion and underwent successful mechanical thrombectomy with decay to be revascularization but remains aphasic despite that.  Maintain strict blood pressure control as per post intervention protocol and close neurological monitoring. Mobilize out of bed. Physical occupational speech therapy consults.recommend IV heparin given her unstable atrial fibrillation and rapid response is at high risk for further embolization. She was on Xarelto but was taking it on a relatively empty stomach in the morning. Recommend cardiology consult for management of A. Fib. On discussion with daughter at the bedside and answered questions. Discussed with Dr. Johnsie Cancel.This patient is critically ill and at significant risk of neurological worsening, death and care requires constant monitoring of vital signs, hemodynamics,respiratory and cardiac monitoring, extensive review of multiple databases, frequent neurological assessment, discussion with family, other specialists and medical decision making of high complexity.I have made any additions or clarifications directly to the above note.This critical care time does not reflect procedure time, or teaching time or supervisory time of PA/NP/Med Resident etc but could involve care discussion time.  I spent 40 minutes of neurocritical care time  in the care of  this patient.      Antony Contras, MD Medical Director [facility] Pager: [phone] 02/04/2019 1:31 PM   To contact Stroke Continuity provider, please refer to [email or web address] After hours, contact General Neurology

## 2019-02-04 NOTE — Progress Notes (Signed)
Pt HR remains between 110 and 140. She is maxed out on cardizem at 15mg . Cardiology paged.

## 2019-02-04 NOTE — Procedures (Signed)
Cortrak  Person Inserting Tube:  [name] [name], RD Tube Type:  Cortrak - 43 inches Tube Location:  Right nare Initial Placement:  Stomach Secured by: Bridle Technique Used to Measure Tube Placement:  Documented cm marking at nare/ corner of mouth Cortrak Secured At:  77 cm    Cortrak Tube Team Note:  Consult received to place [name] Cortrak feeding tube.   Despite repeated attempts, unable to pass through pylorus. Per cortrak monitor, tube terminates in pyloric channel/anturm  No x-ray is required. RN may begin using tube.   If the tube becomes dislodged please keep the tube and contact the Cortrak team at [email or web address] ([email or web address]) for replacement.  If after hours and replacement cannot be delayed, place [name] NG tube and confirm placement with an abdominal x-ray.   [name] Junes RD, LDN, CNSC Clinical Nutrition Available Tues-Sat via Pager: [phone] 02/04/2019 1:38 PM

## 2019-02-04 NOTE — Progress Notes (Signed)
On-call neuro-hospitalist cross coverage note  Troponin 0.2, down from 0.59. Trend troponins.  Further recommendations per cardiology.  Dr. Lorraine Lax placed consult.  Refer to the initial consult.  -- Amie Portland, MD [facility] Pager: [phone] If 7pm to 7am, please call on call as listed on AMION.

## 2019-02-04 NOTE — Progress Notes (Signed)
*  PRELIMINARY RESULTS* Echocardiogram 2D Echocardiogram has been performed.  [name] [name] 02/04/2019, 11:20 AM

## 2019-02-04 NOTE — Consult Note (Signed)
Cardiology Consultation:   Patient ID: [name] [name] MRN: [id number]; DOB: 07-19-1933  Admit date: 02/03/2019 Date of Consult: 02/04/2019  Primary Care Provider: Maggie Schwalbe, PA-C Primary Cardiologist: [name] [name]  Primary Electrophysiologist:  None     Patient Profile:   [name] [name] is a 85 y.o. female with a hx of PAF who is being seen today for the evaluation of afib at the request of [name] [name].  History of Present Illness:   [name] [name] 85 y.o. history of PAF. Was on amiodarone and xarelto 15 mg daily Admitted with large left MCA stroke. Now s/o IR embolectomy. Unfortunately still has dense weakness and aphasia Compliant with low dose 15 mg Xarleto but had not been taking with largest meal of the day. No history of CAD, CHF, bleeding issues. CT with no bleed.  She was found down at home by family and prior to this had no cardiac symptoms Currently NPO on iv cardizem for rate control and not on anticoagulation   Past Medical History:  Diagnosis Date  . Abnormal EKG   . HLD (hyperlipidemia)   . HTN (hypertension)     Past Surgical History:  Procedure Laterality Date  . CATARACT EXTRACTION  2006  . [place]  . hysterectomy - unknown type  1978     Home Medications:  Prior to Admission medications   Medication Sig Start Date End Date Taking? Authorizing Provider  albuterol (PROVENTIL) (2.5 MG/3ML) 0.083% nebulizer solution Inhale 3 mLs into the lungs every 4 (four) hours as needed for wheezing. 02/02/19   [provider]  amiodarone (PACERONE) 200 MG tablet Take 2 tablets (400 mg total) by mouth daily. Take 400 mg by mouth once daily for 2 weeks, then to 200 mg once daily for 2 weeks, then 100 mg once daily. 01/31/19   [name] [name], [name] Cliche, MD  diazepam (VALIUM) 2 MG tablet Take 2 mg by mouth as needed.      [provider]  levothyroxine (SYNTHROID, LEVOTHROID) 75 MCG tablet Take 75 mcg by mouth daily.     [provider]  montelukast  (SINGULAIR) 10 MG tablet Take 1 tablet by mouth daily. 04/21/18   [provider]  omeprazole (PRILOSEC) 20 MG capsule Take 20 mg by mouth 2 (two) times daily.      [provider]  Rivaroxaban (XARELTO) 15 MG TABS tablet Take 1 tablet (15 mg total) by mouth daily. 02/01/19   [name] [name], [name] Cliche, MD  simvastatin (ZOCOR) 20 MG tablet Take 20 mg by mouth at bedtime.     [provider]    Inpatient Medications: Scheduled Meds: .  stroke: mapping our early stages of recovery book   Does not apply Once  . heparin  5,000 Units Subcutaneous Q8H   Continuous Infusions: . sodium chloride 75 mL/hr at 02/04/19 0912  . sodium chloride 75 mL/hr at 02/03/19 1900  . clevidipine Stopped (02/04/19 0840)  . diltiazem (CARDIZEM) infusion 5 mg/hr (02/04/19 0849)   PRN Meds: acetaminophen **OR** acetaminophen (TYLENOL) oral liquid 160 mg/5 mL **OR** acetaminophen, eptifibatide, metoprolol tartrate, nitroGLYCERIN 1.5mg /46ml(25 mcg/ml) - [facility]-IR, senna-docusate  Allergies:    Allergies  Allergen Reactions  . Amoxicillin-Pot Clavulanate     REACTION: Reaction not known  . Codeine     REACTION: Reaction not known  . Metoclopramide Hcl     REACTION: Reaction not known  . Morphine     REACTION: Reaction not known  . Nadolol Other (See Comments)    UNKNOWN  .  Sulfamethoxazole-Trimethoprim Diarrhea  . Valacyclovir Diarrhea    Social History:   Social History   Socioeconomic History  . Marital status: Married    Spouse name: Not on file  . Number of children: Not on file  . Years of education: Not on file  . Highest education level: Not on file  Occupational History  . Occupation: Retired  Scientific laboratory technician  . Financial resource strain: Not on file  . Food insecurity:    Worry: Not on file    Inability: Not on file  . Transportation needs:    Medical: Not on file    Non-medical: Not on file  Tobacco Use  . Smoking status: Former Smoker    Last attempt to quit: 05/21/1990     Years since quitting: 28.7  . Smokeless tobacco: Never Used  Substance and Sexual Activity  . Alcohol use: Not on file  . Drug use: No  . Sexual activity: Not on file  Lifestyle  . Physical activity:    Days per week: Not on file    Minutes per session: Not on file  . Stress: Not on file  Relationships  . Social connections:    Talks on phone: Not on file    Gets together: Not on file    Attends religious service: Not on file    Active member of club or organization: Not on file    Attends meetings of clubs or organizations: Not on file    Relationship status: Not on file  . Intimate partner violence:    Fear of current or ex partner: Not on file    Emotionally abused: Not on file    Physically abused: Not on file    Forced sexual activity: Not on file  Other Topics Concern  . Not on file  Social History Narrative  . Not on file    Family History:    Family History  Problem Relation Age of Onset  . Aneurysm Unknown   . Cancer Unknown      ROS:  Please see the history of present illness.   All other ROS reviewed and negative.     Physical Exam/Data:   Vitals:   02/04/19 0400 02/04/19 0500 02/04/19 0600 02/04/19 0800  BP: (!) 142/49 (!) 130/59 (!) 132/53   Pulse:  (!) 108 (!) 116   Resp:   18   Temp: 99.8 F (37.7 C)   99 F (37.2 C)  TempSrc: Axillary   Axillary  SpO2:  100% 100%     Intake/Output Summary (Last 24 hours) at 02/04/2019 0948 Last data filed at 02/04/2019 0000 Gross per 24 hour  Intake 922.83 ml  Output 850 ml  Net 72.83 ml   Last 3 Weights 02/01/2019 01/31/2019 01/03/2019  Weight (lbs) 128 lb 128 lb 128 lb  Weight (kg) 58.06 kg 58.06 kg 58.06 kg     There is no height or weight on file to calculate BMI.  General:  Well nourished, well developed, in no acute distress  HEENT: normal Lymph: no adenopathy Neck: no JVD Endocrine:  No thryomegaly Vascular: No carotid bruits; FA pulses 2+ bilaterally without bruits  Cardiac:  normal S1, S2;  RRR; no murmur   Lungs:  clear to auscultation bilaterally, no wheezing, rhonchi or rales  Abd: soft, nontender, no hepatomegaly  Ext: no edema Musculoskeletal:  No deformities, BUE and BLE strength normal and equal Skin: warm and dry  Neuro:  Aphasic with right sided weakness  Psych:  Normal  affect   EKG:  The EKG was personally reviewed and demonstrates:  afib nonspecific ST changes  Telemetry:  Telemetry was personally reviewed and demonstrates:  afib rates 90-100  Relevant CV Studies: TTE pending   Laboratory Data:  Chemistry Recent Labs  Lab 01/31/19 0951 02/03/19 1104 02/03/19 1122 02/04/19 0408  NA 143  --  143 143  K 3.6  --  3.0* 2.9*  CL 101  --  103 110  CO2 26  --  25 20*  GLUCOSE 102*  --  108* 133*  BUN 21  --  14 12  CREATININE 1.20* 1.00 1.18* 1.28*  CALCIUM 8.9  --  8.9 8.1*  GFRNONAA 42*  --  43* 39*  GFRAA 48*  --  49* 45*  ANIONGAP  --   --  15 13    Recent Labs  Lab 01/31/19 0951 02/03/19 1122  PROT 5.8* 6.5  ALBUMIN 3.5* 2.9*  AST 15 20  ALT 9 10  ALKPHOS 71 55  BILITOT 0.6 1.0   Hematology Recent Labs  Lab 02/03/19 1122 02/04/19 0408  WBC 12.3* 16.7*  RBC 4.04 3.16*  HGB 12.2 9.9*  HCT 39.5 30.8*  MCV 97.8 97.5  MCH 30.2 31.3  MCHC 30.9 32.1  RDW 14.2 14.5  PLT 182 179   Cardiac Enzymes Recent Labs  Lab 02/03/19 2208 02/04/19 0408  TROPONINI 0.20* 0.17*    Recent Labs  Lab 02/03/19 1102  TROPIPOC 0.59*    BNPNo results for input(s): BNP, PROBNP in the last 168 hours.  DDimer No results for input(s): DDIMER in the last 168 hours.  Radiology/Studies:  Dg Chest 1 View  Result Date: 02/03/2019 CLINICAL DATA:  Stroke. EXAM: CHEST  1 VIEW COMPARISON:  December 15, 2018 FINDINGS: Stable cardiomegaly. The hila and mediastinum are unremarkable. No pneumothorax. No focal infiltrate in the lungs. IMPRESSION: No active disease. Electronically Signed   By: Dorise Bullion III M.D   On: 02/03/2019 19:35   Mr Brain Wo  Contrast  Result Date: 02/04/2019 CLINICAL DATA:  Follow-up examination for acute stroke. EXAM: MRI HEAD WITHOUT CONTRAST TECHNIQUE: Multiplanar, multiecho pulse sequences of the brain and surrounding structures were obtained without intravenous contrast. COMPARISON:  Prior CT and CTA from 02/03/2019 FINDINGS: Brain: Advanced age-related cerebral atrophy with chronic microvascular ischemic disease. Patchy and confluent restricted diffusion involving the left insula as well as the overlying left frontal operculum and left frontal cortical in underlying subcortical and deep white matter seen, compatible with moderate size acute left MCA territory infarct. No associated hemorrhage or significant mass effect. Focus of susceptibility artifact at the base of the left sylvian fissure suspected to reflect possible residual and/or recurrent thrombus within a left M2 branch. Few additional scattered subcentimeter cortical infarcts seen involving the left occipital pole as well as the posterior left hippocampal formation, likely related to the fetal type left PCA seen on prior CTA. Additional punctate 6 mm right cerebellar nonhemorrhagic infarct noted (series 5, image 56). No mass lesion, midline shift, or significant mass effect. No hydrocephalus. No extra-axial fluid collection. Pituitary gland within normal limits. Midline structures intact. Vascular: Major intracranial vascular flow voids maintained. Left vertebral artery hypoplastic and not well seen. Skull and upper cervical spine: Degenerative thickening at the tectorial membrane with mild narrowing at the craniocervical junction. Upper cervical spine normal. Bone marrow signal intensity within normal limits. No scalp soft tissue abnormality. Sinuses/Orbits: Patient status post bilateral ocular lens replacement. Paranasal sinuses are clear. No significant mastoid  effusion. Inner ear structures normal. Other: None. IMPRESSION: 1. Moderate-sized acute nonhemorrhagic  ischemic anterior left MCA territory infarct. 2. Few additional small volume cortical infarcts involving the mesial left temporal lobe and left occipital pole, likely related to fetal type left PCA seen on prior CTA. 3. Additional 6 mm acute ischemic nonhemorrhagic right cerebellar infarct. 4. Focus of susceptibility artifact at the base of the left sylvian fissure, suspected to reflect residual and/or recurrent thrombus within a proximal left M2 branch. 5. Underlying atrophy with advanced chronic microvascular ischemic disease. Electronically Signed   By: Jeannine Boga M.D.   On: 02/04/2019 05:25    Assessment and Plan:   1. Afib:  Known paroxysmal afib. Had stroke despite taking Xarelto 15 mg daily Discussed with [name] [name] He indicated they see failures frequently if xarelto no taken with largest meal of the day due to large first pass clearance. Will continue iv cardizem for rate control since she is NPO. Start heparin per neurology Likely best to change to eliquis on d/c since stroke occurred on xarlto. Can start iv amiodarone if needed once on iv heparin since she was on PO prior to stroke  Will order TTE while in hospital to check for SOE and make sure EF normal 2. CVA:  66 French right femoral artery sheath being removed. From IR. Will need swallowing study sever aphasia likely NPO for a while MRI today with moderate non hemorrhagic left MCA stroke       For questions or updates, please contact [place] Please consult [email or web address] for contact info under     Signed, Jenkins Rouge, MD  02/04/2019 9:48 AM

## 2019-02-04 NOTE — Progress Notes (Signed)
Neurology Progress Note   S:// Called by RN for patient seizing. Seizure started with focal twitching of the right arm and then generalized.  Lasted 30 seconds. Required no medication and aborted spontaneously. After a few minutes, had another 20-second similar episode. When I examined the patient, she had a right gaze, minimally responsive and only localizing with a left with no movement on the right.   O:// Current vital signs: BP 135/84   Pulse (!) 143   Temp 97.9 F (36.6 C) (Axillary)   Resp 18   SpO2 96%  Vital signs in last 24 hours: Temp:  [97.9 F (36.6 C)-99.8 F (37.7 C)] 97.9 F (36.6 C) (02/15 2000) Pulse Rate:  [30-189] 143 (02/15 2100) Resp:  [7-27] 18 (02/15 2100) BP: (103-160)/(49-131) 135/84 (02/15 2100) SpO2:  [81 %-100 %] 96 % (02/15 2100) Arterial Line BP: (115-144)/(41-63) 128/63 (02/15 0900) Neurological exam Patient is drowsy, does not open eyes to voice. She does not follow any commands. She is nonverbal Cranial nerves: Forced rightward gaze, pupils equal round reactive to light, does not blink to threat from either side, right lower facial weakness. Motor exam: On noxious stimulation, localizes with the left arm but minimal movement noticed on the right.  Sensor exam: As above Coordination: Did not cooperate   NIHSS 1a Level of Conscious.: 2 1b LOC Questions: 2 1c LOC Commands: 2 2 Best Gaze: 2 3 Visual: 2 4 Facial Palsy: 2 5a Motor Arm - left: 1 5b Motor Arm - Right: 2 6a Motor Leg - Left: 1 6b Motor Leg - Right: 2 7 Limb Ataxia: 0 8 Sensory: 0 9 Best Language: 3 10 Dysarthria: 2 11 Extinct. and Inatten.: 0 TOTAL: 23   Medications  Current Facility-Administered Medications:  .   stroke: mapping our early stages of recovery book, , Does not apply, Once, Williams, Jessica N, NP .  0.9 %  sodium chloride infusion, , Intravenous, Continuous, Vonzella Nipple, NP, Stopped at 02/04/19 1820 .  acetaminophen (TYLENOL) tablet 650 mg,  650 mg, Oral, Q4H PRN **OR** acetaminophen (TYLENOL) solution 650 mg, 650 mg, Per Tube, Q4H PRN **OR** acetaminophen (TYLENOL) suppository 650 mg, 650 mg, Rectal, Q4H PRN, Vonzella Nipple, NP .  chlorhexidine (PERIDEX) 0.12 % solution 15 mL, 15 mL, Mouth Rinse, BID, Garvin Fila, MD, 15 mL at 02/04/19 2138 .  diltiazem (CARDIZEM) 100 mg in dextrose 5% 113mL (1 mg/mL) infusion, 5-15 mg/hr, Intravenous, Titrated, Garvin Fila, MD, Last Rate: 10 mL/hr at 02/04/19 2100, 10 mg/hr at 02/04/19 2100 .  eptifibatide (INTEGRILIN) injection, , , PRN, Deveshwar, Sanjeev, MD, 1.5 mg at 02/03/19 1250 .  heparin ADULT infusion 100 units/mL (25000 units/212mL sodium chloride 0.45%), 800 Units/hr, Intravenous, Continuous, Masters, Jake Church, [facility], Last Rate: 8 mL/hr at 02/04/19 2100, 800 Units/hr at 02/04/19 2100 .  LORazepam (ATIVAN) 2 MG/ML injection, , , ,  .  MEDLINE mouth rinse, 15 mL, Mouth Rinse, q12n4p, Garvin Fila, MD, 15 mL at 02/04/19 1650 .  metoprolol tartrate (LOPRESSOR) injection 5 mg, 5 mg, Intravenous, Q5 min PRN, Aroor, Lanice Schwab, MD, 5 mg at 02/04/19 0840 .  metoprolol tartrate (LOPRESSOR) tablet 25 mg, 25 mg, Per Tube, Q8H, Josue Hector, MD, 25 mg at 02/04/19 2138 .  nitroGLYCERIN 25 mcg in sodium chloride (PF) 0.9 % 59.71 mL (25 mcg/mL) syringe, , , PRN, Luanne Bras, MD, 25 mcg at 02/03/19 1252 .  senna-docusate (Senokot-S) tablet 1 tablet, 1 tablet, Oral, QHS PRN, Laurey Morale  N, NP Labs CBC    Component Value Date/Time   WBC 16.7 (H) 02/04/2019 0408   RBC 3.16 (L) 02/04/2019 0408   HGB 9.9 (L) 02/04/2019 0408   HCT 30.8 (L) 02/04/2019 0408   PLT 179 02/04/2019 0408   MCV 97.5 02/04/2019 0408   MCH 31.3 02/04/2019 0408   MCHC 32.1 02/04/2019 0408   RDW 14.5 02/04/2019 0408   LYMPHSABS 0.5 (L) 02/04/2019 0408   MONOABS 0.9 02/04/2019 0408   EOSABS 0.0 02/04/2019 0408   BASOSABS 0.0 02/04/2019 0408    CMP     Component Value Date/Time   NA 143  02/04/2019 0408   NA 143 01/31/2019 0951   K 2.9 (L) 02/04/2019 0408   CL 110 02/04/2019 0408   CO2 20 (L) 02/04/2019 0408   GLUCOSE 133 (H) 02/04/2019 0408   BUN 12 02/04/2019 0408   BUN 21 01/31/2019 0951   CREATININE 1.28 (H) 02/04/2019 0408   CALCIUM 8.1 (L) 02/04/2019 0408   PROT 6.5 02/03/2019 1122   PROT 5.8 (L) 01/31/2019 0951   ALBUMIN 2.9 (L) 02/03/2019 1122   ALBUMIN 3.5 (L) 01/31/2019 0951   AST 20 02/03/2019 1122   ALT 10 02/03/2019 1122   ALKPHOS 55 02/03/2019 1122   BILITOT 1.0 02/03/2019 1122   BILITOT 0.6 01/31/2019 0951   GFRNONAA 39 (L) 02/04/2019 0408   GFRAA 45 (L) 02/04/2019 0408    glycosylated hemoglobin  Lipid Panel     Component Value Date/Time   CHOL 108 02/04/2019 1141   TRIG 77 02/04/2019 1141   HDL 30 (L) 02/04/2019 1141   CHOLHDL 3.6 02/04/2019 1141   VLDL 15 02/04/2019 1141   LDLCALC 63 02/04/2019 1141   maging I have reviewed images in epic and the results pertinent to this consultation are: Stat CT-scan of the brain-expected evolution of the left MCA stroke.  Mild hypodensity in the left MCA branch in sylvian fissure probably residual from the clot of the known stroke and left MCA occlusion.  Assessment: 85 year old woman past history of hypertension hyperlipidemia atrial fibrillation on Xarelto presenting with aphasia right facial droop and right hemiparesis and did not receive TPA due to anticoagulation on board, with M2 occlusion with mechanical thrombectomy in IR and MRI subsequently showing multiple infarcts in the anterior left MCA territory as well as small cortical infarcts in the left occipital mesial temporal lobe and 6 mm acute ischemic right cerebellar stroke. She had 2 episodes of seizure-like activity that started focally on the left and then generalized. Likely seizures from the cortical irritation of the strokes. Got 1 mg of Ativan and the second seizure broke with that. Loaded with Keppra.  Impression: Acute ischemic  stroke New onset seizure, possibly status epilepticus.  Recommendations: Keppra 1 g IV now Follow with Keppra 500 twice daily Close neuro monitoring in the ICU Clinically not seizing anymore. Obtain EEG in the morning.  Will obtain stat EEG if has another episode. Talk to the husband.  Explained the situation and philosophy of care.  He is okay with her being intubated.  She is still remains full code.  If it comes to that she is unable to protect airway, we will consult PCCM and intubate the patient. Stroke team to continue to follow    -- Amie Portland, MD [facility] Pager: [phone] If 7pm to 7am, please call on call as listed on AMION.  CRITICAL CARE ATTESTATION Performed by: Amie Portland, MD Total critical care time: 40 minutes Critical care  time was exclusive of separately billable procedures and treating other patients and/or supervising APPs/Residents/Students Critical care was necessary to treat or prevent imminent or life-threatening deterioration due to acute ischemic stroke, seizure, status epilepticus This patient is critically ill and at significant risk for neurological worsening and/or death and care requires constant monitoring. Critical care was time spent personally by me on the following activities: development of treatment plan with patient and/or surrogate as well as nursing, discussions with consultants, evaluation of patient's response to treatment, examination of patient, obtaining history from patient or surrogate, ordering and performing treatments and interventions, ordering and review of laboratory studies, ordering and review of radiographic studies, pulse oximetry, re-evaluation of patient's condition, participation in multidisciplinary rounds and medical decision making of high complexity in the care of this patient.

## 2019-02-04 NOTE — Progress Notes (Signed)
PT Cancellation Note  Patient Details Name: [name] [name] MRN: [id number] DOB: 1933/01/25   Cancelled Treatment:    Reason Eval/Treat Not Completed: Active bedrest order. Per chart review, plan is for R groin sheath to be removed this morning with bedrest orders in place after. Will continue to follow for medical readiness to participate in PT eval.    Thelma Comp 02/04/2019, 7:25 AM   Rolinda Roan, PT, DPT Acute Rehabilitation Services Pager: [phone] Office: [phone]

## 2019-02-04 NOTE — Progress Notes (Signed)
8 fr RFA sheath removed at 0910 using hemostasis pad and manual pressure.  Sheath intact upon removal.  Hemostasis obtained at 945am.   Groin site stable. No complications or hematoma.  Site dressed with tegaderm and gauze. Distal pulses intact. Site reviewed with RN.

## 2019-02-04 NOTE — Progress Notes (Signed)
Supervising Physician: Luanne Bras  Patient Status: [facility] - In-pt  Subjective: S/p cerebral arteriogram with emergent mechanical thrombectomy of left MCA occlusion achieving a TICI 2b revascularization Pt awake. Opens eyes but not following any commands. Moves ([place]. Flaccid right. Family at bedside  Objective: Physical Exam: BP 124/84   Pulse (!) 124   Temp 99 F (37.2 C) (Axillary)   Resp 19   SpO2 97%  (R)groin soft, NT, no hematoma Feet warm, good pedal pulses  Current Facility-Administered Medications:  .   stroke: mapping our early stages of recovery book, , Does not apply, Once, Williams, Jessica N, NP .  0.9 %  sodium chloride infusion, , Intravenous, Continuous, Vonzella Nipple, NP, Last Rate: 75 mL/hr at 02/04/19 0912 .  0.9 %  sodium chloride infusion, , Intravenous, Continuous, Deveshwar, Sanjeev, MD, Last Rate: 75 mL/hr at 02/03/19 1900 .  acetaminophen (TYLENOL) tablet 650 mg, 650 mg, Oral, Q4H PRN **OR** acetaminophen (TYLENOL) solution 650 mg, 650 mg, Per Tube, Q4H PRN **OR** acetaminophen (TYLENOL) suppository 650 mg, 650 mg, Rectal, Q4H PRN, Vonzella Nipple, NP .  clevidipine (CLEVIPREX) infusion 0.5 mg/mL, 0-21 mg/hr, Intravenous, Continuous, Luanne Bras, MD, Stopped at 02/04/19 0840 .  diltiazem (CARDIZEM) 100 mg in dextrose 5% 114mL (1 mg/mL) infusion, 5-15 mg/hr, Intravenous, Titrated, Garvin Fila, MD, Last Rate: 15 mL/hr at 02/04/19 0950, 15 mg/hr at 02/04/19 0950 .  eptifibatide (INTEGRILIN) injection, , , PRN, Deveshwar, Sanjeev, MD, 1.5 mg at 02/03/19 1250 .  heparin ADULT infusion 100 units/mL (25000 units/239mL sodium chloride 0.45%), 800 Units/hr, Intravenous, Continuous, Masters, Jake Church, [facility], Last Rate: 8 mL/hr at 02/04/19 1048, 800 Units/hr at 02/04/19 1048 .  metoprolol tartrate (LOPRESSOR) injection 5 mg, 5 mg, Intravenous, Q5 min PRN, Aroor, Lanice Schwab, MD, 5 mg at 02/04/19 0840 .  nitroGLYCERIN 25 mcg in sodium  chloride (PF) 0.9 % 59.71 mL (25 mcg/mL) syringe, , , PRN, Luanne Bras, MD, 25 mcg at 02/03/19 1252 .  senna-docusate (Senokot-S) tablet 1 tablet, 1 tablet, Oral, QHS PRN, Vonzella Nipple, NP  Labs: CBC Recent Labs    02/03/19 1122 02/04/19 0408  WBC 12.3* 16.7*  HGB 12.2 9.9*  HCT 39.5 30.8*  PLT 182 179   BMET Recent Labs    02/03/19 1122 02/04/19 0408  NA 143 143  K 3.0* 2.9*  CL 103 110  CO2 25 20*  GLUCOSE 108* 133*  BUN 14 12  CREATININE 1.18* 1.28*  CALCIUM 8.9 8.1*   LFT Recent Labs    02/03/19 1122  PROT 6.5  ALBUMIN 2.9*  AST 20  ALT 10  ALKPHOS 55  BILITOT 1.0   PT/INR Recent Labs    02/03/19 1122  LABPROT 13.9  INR 1.08     Studies/Results: Dg Chest 1 View  Result Date: 02/03/2019 CLINICAL DATA:  Stroke. EXAM: CHEST  1 VIEW COMPARISON:  December 15, 2018 FINDINGS: Stable cardiomegaly. The hila and mediastinum are unremarkable. No pneumothorax. No focal infiltrate in the lungs. IMPRESSION: No active disease. Electronically Signed   By: Dorise Bullion III M.D   On: 02/03/2019 19:35   Mr Brain Wo Contrast  Result Date: 02/04/2019 CLINICAL DATA:  Follow-up examination for acute stroke. EXAM: MRI HEAD WITHOUT CONTRAST TECHNIQUE: Multiplanar, multiecho pulse sequences of the brain and surrounding structures were obtained without intravenous contrast. COMPARISON:  Prior CT and CTA from 02/03/2019 FINDINGS: Brain: Advanced age-related cerebral atrophy with chronic microvascular ischemic disease. Patchy and confluent restricted diffusion  involving the left insula as well as the overlying left frontal operculum and left frontal cortical in underlying subcortical and deep white matter seen, compatible with moderate size acute left MCA territory infarct. No associated hemorrhage or significant mass effect. Focus of susceptibility artifact at the base of the left sylvian fissure suspected to reflect possible residual and/or recurrent thrombus within a  left M2 branch. Few additional scattered subcentimeter cortical infarcts seen involving the left occipital pole as well as the posterior left hippocampal formation, likely related to the fetal type left PCA seen on prior CTA. Additional punctate 6 mm right cerebellar nonhemorrhagic infarct noted (series 5, image 56). No mass lesion, midline shift, or significant mass effect. No hydrocephalus. No extra-axial fluid collection. Pituitary gland within normal limits. Midline structures intact. Vascular: Major intracranial vascular flow voids maintained. Left vertebral artery hypoplastic and not well seen. Skull and upper cervical spine: Degenerative thickening at the tectorial membrane with mild narrowing at the craniocervical junction. Upper cervical spine normal. Bone marrow signal intensity within normal limits. No scalp soft tissue abnormality. Sinuses/Orbits: Patient status post bilateral ocular lens replacement. Paranasal sinuses are clear. No significant mastoid effusion. Inner ear structures normal. Other: None. IMPRESSION: 1. Moderate-sized acute nonhemorrhagic ischemic anterior left MCA territory infarct. 2. Few additional small volume cortical infarcts involving the mesial left temporal lobe and left occipital pole, likely related to fetal type left PCA seen on prior CTA. 3. Additional 6 mm acute ischemic nonhemorrhagic right cerebellar infarct. 4. Focus of susceptibility artifact at the base of the left sylvian fissure, suspected to reflect residual and/or recurrent thrombus within a proximal left M2 branch. 5. Underlying atrophy with advanced chronic microvascular ischemic disease. Electronically Signed   By: Jeannine Boga M.D.   On: 02/04/2019 05:25  3  Assessment/Plan: S/p cerebral arteriogram with emergent mechanical thrombectomy of left MCA occlusion achieving a TICI 2b revascularization. IR following, plan for Stroke team    LOS: 1 day   I spent a total of 15 minutes in face to face in  clinical consultation, greater than 50% of which was counseling/coordinating care for [facility] CVA  Ascencion Dike PA-C 02/04/2019 11:48 AM

## 2019-02-05 ENCOUNTER — Inpatient Hospital Stay: Payer: Self-pay

## 2019-02-05 ENCOUNTER — Inpatient Hospital Stay (HOSPITAL_COMMUNITY): Payer: Medicare Other

## 2019-02-05 LAB — BASIC METABOLIC PANEL
Anion gap: 4 — ABNORMAL LOW (ref 5–15)
BUN: 19 mg/dL (ref 8–23)
CO2: 22 mmol/L (ref 22–32)
Calcium: 7.9 mg/dL — ABNORMAL LOW (ref 8.9–10.3)
Chloride: 114 mmol/L — ABNORMAL HIGH (ref 98–111)
Creatinine, Ser: 1.38 mg/dL — ABNORMAL HIGH (ref 0.44–1.00)
GFR calc Af Amer: 41 mL/min — ABNORMAL LOW (ref >60)
GFR calc non Af Amer: 35 mL/min — ABNORMAL LOW (ref >60)
Glucose, Bld: 108 mg/dL — ABNORMAL HIGH (ref 70–99)
Potassium: 3.8 mmol/L (ref 3.5–5.1)
Sodium: 140 mmol/L (ref 135–145)

## 2019-02-05 LAB — POCT I-STAT 7, (LYTES, BLD GAS, ICA,H+H)
Acid-base deficit: 3 mmol/L — ABNORMAL HIGH (ref 0.0–2.0)
Bicarbonate: 20.8 mmol/L (ref 20.0–28.0)
Calcium, Ion: 1.14 mmol/L — ABNORMAL LOW (ref 1.15–1.40)
HCT: 26 % — ABNORMAL LOW (ref 36.0–46.0)
Hemoglobin: 8.8 g/dL — ABNORMAL LOW (ref 12.0–15.0)
O2 Saturation: 99 %
PH ART: 7.425 (ref 7.350–7.450)
Patient temperature: 97.9
Potassium: 4 mmol/L (ref 3.5–5.1)
Sodium: 141 mmol/L (ref 135–145)
TCO2: 22 mmol/L (ref 22–32)
pCO2 arterial: 31.6 mmHg — ABNORMAL LOW (ref 32.0–48.0)
pO2, Arterial: 144 mmHg — ABNORMAL HIGH (ref 83.0–108.0)

## 2019-02-05 LAB — HEPARIN LEVEL (UNFRACTIONATED)
Heparin Unfractionated: 0.57 IU/mL (ref 0.30–0.70)
Heparin Unfractionated: 0.17 IU/mL — ABNORMAL LOW (ref 0.30–0.70)
Heparin Unfractionated: 0.36 IU/mL (ref 0.30–0.70)

## 2019-02-05 LAB — CBC
HCT: 29.9 % — ABNORMAL LOW (ref 36.0–46.0)
Hemoglobin: 9.1 g/dL — ABNORMAL LOW (ref 12.0–15.0)
MCH: 30 pg (ref 26.0–34.0)
MCHC: 30.4 g/dL (ref 30.0–36.0)
MCV: 98.7 fL (ref 80.0–100.0)
NRBC: 0 % (ref 0.0–0.2)
Platelets: 161 10*3/uL (ref 150–400)
RBC: 3.03 MIL/uL — ABNORMAL LOW (ref 3.87–5.11)
RDW: 14.6 % (ref 11.5–15.5)
WBC: 11.9 10*3/uL — ABNORMAL HIGH (ref 4.0–10.5)

## 2019-02-05 LAB — MAGNESIUM: Magnesium: 1.7 mg/dL (ref 1.7–2.4)

## 2019-02-05 LAB — TROPONIN I
Troponin I: 0.14 ng/mL (ref <0.03)
Troponin I: 0.1 ng/mL (ref <0.03)

## 2019-02-05 MED ORDER — CHLORHEXIDINE GLUCONATE CLOTH 2 % EX PADS
6.0000 | MEDICATED_PAD | Freq: Every day | CUTANEOUS | Status: DC
  Administered 2019-02-06 – 2019-02-07 (×2): 6 via TOPICAL

## 2019-02-05 MED ORDER — LIDOCAINE HCL 2 % IJ SOLN
20.0000 mL | Freq: Once | INTRAMUSCULAR | Status: AC
  Administered 2019-02-05: 400 mg
  Filled 2019-02-05: qty 20

## 2019-02-05 MED ORDER — SODIUM CHLORIDE 0.9 % IV SOLN
INTRAVENOUS | Status: DC | PRN
  Administered 2019-02-05: 250 mL via INTRAVENOUS

## 2019-02-05 MED ORDER — MIDAZOLAM HCL 2 MG/2ML IJ SOLN
INTRAMUSCULAR | Status: AC
  Filled 2019-02-05: qty 2

## 2019-02-05 MED ORDER — SODIUM CHLORIDE 0.9% FLUSH
10.0000 mL | Freq: Two times a day (BID) | INTRAVENOUS | Status: DC
  Administered 2019-02-05: 30 mL
  Administered 2019-02-06 (×2): 10 mL

## 2019-02-05 MED ORDER — FENTANYL CITRATE (PF) 100 MCG/2ML IJ SOLN: 50.0000 ug | INTRAMUSCULAR | Status: DC | PRN

## 2019-02-05 MED ORDER — CHLORHEXIDINE GLUCONATE 0.12% ORAL RINSE (MEDLINE KIT)
15.0000 mL | Freq: Two times a day (BID) | OROMUCOSAL | Status: DC
  Administered 2019-02-05 – 2019-02-07 (×5): 15 mL via OROMUCOSAL

## 2019-02-05 MED ORDER — SODIUM CHLORIDE 0.9% FLUSH: 10.0000 mL | INTRAVENOUS | Status: DC | PRN

## 2019-02-05 MED ORDER — ORAL CARE MOUTH RINSE
15.0000 mL | OROMUCOSAL | Status: DC
  Administered 2019-02-05 – 2019-02-06 (×15): 15 mL via OROMUCOSAL

## 2019-02-05 MED ORDER — SODIUM CHLORIDE 0.9 % IV SOLN
750.0000 mg | Freq: Two times a day (BID) | INTRAVENOUS | Status: DC
  Administered 2019-02-05 – 2019-02-07 (×4): 750 mg via INTRAVENOUS
  Filled 2019-02-05 (×5): qty 7.5

## 2019-02-05 MED ORDER — FENTANYL CITRATE (PF) 100 MCG/2ML IJ SOLN
50.0000 ug | INTRAMUSCULAR | Status: DC | PRN
  Administered 2019-02-05 – 2019-02-06 (×5): 50 ug via INTRAVENOUS
  Filled 2019-02-05 (×5): qty 2

## 2019-02-05 MED ORDER — FENTANYL CITRATE (PF) 100 MCG/2ML IJ SOLN
INTRAMUSCULAR | Status: AC
  Administered 2019-02-05: 75 ug
  Filled 2019-02-05: qty 2

## 2019-02-05 NOTE — Plan of Care (Signed)
  Problem: Education: Goal: Knowledge of disease or condition will improve Outcome: Progressing   

## 2019-02-05 NOTE — Progress Notes (Signed)
STROKE TEAM PROGRESS NOTE     SUBJECTIVE (INTERVAL HISTORY)  patient had neurological worsening yesterday as she had 2 witnessed focal seizures for which she was treated with Keppra. Subsequently she developed respiratory distress requiring intubation. She had stat CT scan of the head obtained which showed no acute abnormality in the brain or hemorrhage. She remains on heparin drip.She is currently on sedation. Her husband is at the bedside.  OBJECTIVE Vitals:   02/05/19 1100 02/05/19 1115 02/05/19 1130 02/05/19 1200  BP: 112/63 (!) 109/51 (!) 140/57   Pulse: (!) 44 (!) 43 90   Resp: 16 16 16    Temp:    98.7 F (37.1 C)  TempSrc:    Axillary  SpO2: 100% 100% 100%   Height:        CBC:  Recent Labs  Lab 02/03/19 1122 02/04/19 0408 02/05/19 0213 02/05/19 0536  WBC 12.3* 16.7*  --  11.9*  NEUTROABS 10.4* 15.2*  --   --   HGB 12.2 9.9* 8.8* 9.1*  HCT 39.5 30.8* 26.0* 29.9*  MCV 97.8 97.5  --  98.7  PLT 182 179  --  361    Basic Metabolic Panel:  Recent Labs  Lab 01/31/19 0951  02/04/19 0408 02/05/19 0213 02/05/19 0536  NA 143   < > 143 141 140  K 3.6   < > 2.9* 4.0 3.8  CL 101   < > 110  --  114*  CO2 26   < > 20*  --  22  GLUCOSE 102*   < > 133*  --  108*  BUN 21   < > 12  --  19  CREATININE 1.20*   < > 1.28*  --  1.38*  CALCIUM 8.9   < > 8.1*  --  7.9*  MG 1.5*  --   --   --  1.7   < > = values in this interval not displayed.    Lipid Panel:     Component Value Date/Time   CHOL 108 02/04/2019 1141   TRIG 77 02/04/2019 1141   HDL 30 (L) 02/04/2019 1141   CHOLHDL 3.6 02/04/2019 1141   VLDL 15 02/04/2019 1141   LDLCALC 63 02/04/2019 1141   HgbA1c: No results found for: HGBA1C Urine Drug Screen:     Component Value Date/Time   LABOPIA NONE DETECTED 02/04/2019 0012   COCAINSCRNUR NONE DETECTED 02/04/2019 0012   LABBENZ NONE DETECTED 02/04/2019 0012   AMPHETMU NONE DETECTED 02/04/2019 0012   THCU NONE DETECTED 02/04/2019 0012   LABBARB NONE DETECTED  02/04/2019 0012    Alcohol Level     Component Value Date/Time   ETH <10 02/03/2019 1122    IMAGING  Dg Chest 1 View 02/03/2019  IMPRESSION:  No active disease.    Mr Brain Wo Contrast 02/04/2019 IMPRESSION:  1. Moderate-sized acute nonhemorrhagic ischemic anterior left MCA territory infarct.  2. Few additional small volume cortical infarcts involving the mesial left temporal lobe and left occipital pole, likely related to fetal type left PCA seen on prior CTA.  3. Additional 6 mm acute ischemic nonhemorrhagic right cerebellar infarct.  4. Focus of susceptibility artifact at the base of the left sylvian fissure, suspected to reflect residual and/or recurrent thrombus within a proximal left M2 branch.  5. Underlying atrophy with advanced chronic microvascular ischemic disease.    Cerebral Angiogram and Intervention S/P Lt common carotid arterriogram followed by complete revascularization of occluded dominant inf division with x 2 passes with  48mm x 74mm embotrap retriever dice achieving a TICI 2 b revascularization   Transthoracic Echocardiogram  Ejection fraction is greater than 65%. Severe asymmetric left ventricular hypertrophy.    PHYSICAL EXAM Blood pressure (!) 140/57, pulse 90, temperature 98.7 F (37.1 C), temperature source Axillary, resp. rate 16, height 5' (1.524 m), SpO2 100 %. Elderly Caucasian lady who is intubated and sedated, not in distress. . Afebrile. Head is nontraumatic. Neck is supple without bruit.    Cardiac exam no murmur or gallop. Lungs are clear to auscultation. Distal pulses are well felt.  Neurological Exam :  She is intubated, sedated. Eyes are closed. globally aphasic and does not follow commands  She has left gaze preference but will follow gaze to the right past midline. She blinks to threat on the left but less on the right. Right lower facial weakness. Tongue midline. Motor system exam able to move left upper and lower extremities spontaneously  and purposefully. Withdraws from the right lower extremity greater than upper extremity to painful stimuli. Deep tendon reflexes symmetric. Sensation appears intact bilaterally. Plantars downgoing. Gait not tested.     ASSESSMENT/PLAN [name] is a 85 y.o. female with history of hypertension, hyperlipidemia, atrial fibrillation on Xarelto presenting with aphasia, right facial droop and right hemiparesis. She did not receive IV t-PA due to anticoagulation. M2 occlusion with mechanical thrombectomy in IR.  Stroke:  Multiple infarcts including anterior left MCA territory - embolic - history of remote afib.  Resultant  aphasia  CT head - OSH  MRI head - Moderate-sized acute nonhemorrhagic ischemic anterior left MCA territory infarct. Few additional small volume cortical infarcts involving the mesial left temporal lobe and left occipital pole, likely related to fetal type left PCA seen on prior CTA. Additional 6 mm acute ischemic nonhemorrhagic right cerebellar infarct.   MRA head - not performed  CTA H&N - OSH - left M2 occlusion  Carotid Doppler - cerebral angiogram  2D Echo - ejection fraction greater than 65%. No wall motion abnormalities.  LDL - 63 mg percent  HgbA1c - pending  UDS - negative  VTE prophylaxis - SCDs and IV heparin  Diet - NPO  Xarelto (rivaroxaban) daily prior to admission, now on heparin IV  Patient counseled to be compliant with her antithrombotic medications  Ongoing aggressive stroke risk factor management  Therapy recommendations:  pending  Disposition:  Pending  Hypertension  Stable . Permissive hypertension (OK if < 220/120) but gradually normalize in 5-7 days . Long-term BP goal normotensive  Hyperlipidemia  Lipid lowering medication PTA: Zocor 20 mg daily  LDL pending, goal < 70  Current lipid lowering medication: none (await LDL)  Continue statin at discharge   Other Stroke Risk Factors  Advanced age  Former cigarette  smoker - quit  Atrial fibrillation    Other Active Problems  Hypokalemia - supplement  Hypomagnesemia - recheck in AM  Atrial fibrillation - IV heparin - Cardiology consult for rate control  Anemia - monitor - Hb 9.9  Leukocytosis - 16.7 (temp 99) (U/A and CXR not C/W infection) monitor  Creatinine - 1.28  Elevated troponin enzymes - trending down - may be due to RVR - continue to monitor - may need cardiology to address (on IV heparin)  Symptomatic seizures on 02/04/19-Keppra started   Hospital day # 2   . She presented with aphasia due to left M2 occlusion and underwent successful mechanical thrombectomy with decay to be revascularization but remains aphasic despite that. Maintain strict  blood pressure control   and close neurological monitoring. Continue IV heparin drip for stroke prevention given her unstable A. Fib. Appreciate cardiology help. Continue Keppra for seizures. Patient's prognosis is non-guarded as she may need prolonged ventilatory support given the significant aphasia she has likely difficult to protect her airways. I had a long discussion with her husband at the bedside and answered questions.he understands poor prognosis and agrees to DO NOT RESUSCITATE and would like to maintain ventilatory support for the next few days and make a final decision about withdrawal versus trach and PEG if necessary..This patient is critically ill and at significant risk of neurological worsening, death and care requires constant monitoring of vital signs, hemodynamics,respiratory and cardiac monitoring, extensive review of multiple databases, frequent neurological assessment, discussion with family, other specialists and medical decision making of high complexity.I have made any additions or clarifications directly to the above note.This critical care time does not reflect procedure time, or teaching time or supervisory time of PA/NP/Med Resident etc but could involve care discussion  time.  I spent 40 minutes of neurocritical care time  in the care of  this patient.      Antony Contras, MD Medical Director [facility] Pager: [phone] 02/05/2019 2:42 PM   To contact Stroke Continuity provider, please refer to [email or web address] After hours, contact General Neurology

## 2019-02-05 NOTE — Progress Notes (Signed)
OT Cancellation Note  Patient Details Name: [name] [name] MRN: [id number] DOB: [birth year]   Cancelled Treatment:    Reason Eval/Treat Not Completed: Medical issues which prohibited therapy. Pt intubated and medically declining. Will await till pt medically ready for OT evaluation. Thank you.  [place], OTR/L Acute Rehab Pager: [phone] Office: [phone] 02/05/2019, 1:52 PM

## 2019-02-05 NOTE — Progress Notes (Signed)
ANTICOAGULATION CONSULT NOTE  Pharmacy Consult for heparin Indication: atrial fibrillation   Patient Measurements: Heparin Dosing Weight: 58 kg  Vital Signs: Temp: 97.7 F (36.5 C) (02/16 0800) Temp Source: Axillary (02/16 0800) BP: 112/63 (02/16 1100) Pulse Rate: 44 (02/16 1100)  Labs: Recent Labs    02/03/19 1122  02/04/19 0408  02/04/19 2213 02/04/19 2327 02/05/19 0134 02/05/19 0213 02/05/19 0536 02/05/19 0935  HGB 12.2  --  9.9*  --   --   --   --  8.8* 9.1*  --   HCT 39.5  --  30.8*  --   --   --   --  26.0* 29.9*  --   PLT 182  --  179  --   --   --   --   --  161  --   APTT 25  --   --   --   --   --   --   --   --   --   LABPROT 13.9  --   --   --   --   --   --   --   --   --   INR 1.08  --   --   --   --   --   --   --   --   --   HEPARINUNFRC  --   --   --   --   --  0.57  --   --   --  0.17*  CREATININE 1.18*  --  1.28*  --   --   --   --   --  1.38*  --   TROPONINI  --    < > 0.17*   < > 0.13*  --  0.14*  --  0.10*  --    < > = values in this interval not displayed.    Assessment: Patient with known AFib, on Xarelto prior to admission, admitted with acute MCA stroke, unfortunately, appears to have failed Xarelto. She is s/p mechanical thrombectomy. Planning to start patient on heparin infusion until plan for oral anticoagulation has been decided.  Heparin level is now low at 0.17 after decreasing rate only 50 units/hr for slightly elevated level. No bleeding noted. RN notes that level was drawn from the A-line and Heparin is running through a PIV however that IV is not good and currently being changed to a PICC line per IV team.   Goal of Therapy:  Heparin level 0.3-0.5 units/ml Monitor platelets by anticoagulation protocol: Yes  Plan:  Continue heparin gtt at 750 units/hr for now, then redraw level after changing heparin to infuse via PICC line in 4 hours.  Daily heparin level and CBC  Sloan Leiter, PharmD, BCPS, BCCCP Clinical Pharmacist Please  refer to [facility] for [place] numbers 02/05/2019,11:19 AM

## 2019-02-05 NOTE — Progress Notes (Signed)
Peripherally Inserted Central Catheter/Midline Placement  The IV Nurse has discussed with the patient and/or persons authorized to consent for the patient, the purpose of this procedure and the potential benefits and risks involved with this procedure.  The benefits include less needle sticks, lab draws from the catheter, and the patient may be discharged home with the catheter. Risks include, but not limited to, infection, bleeding, blood clot (thrombus formation), and puncture of an artery; nerve damage and irregular heartbeat and possibility to perform a PICC exchange if needed/ordered by physician.  Alternatives to this procedure were also discussed.  Bard Power PICC patient education guide, fact sheet on infection prevention and patient information card has been provided to patient /or left at bedside.  Husband at bedside signed consent due to pt intubated and sedated.  PICC/Midline Placement Documentation  PICC Triple Lumen 02/05/19 PICC Right Brachial 36 cm 0 cm (Active)  Indication for Insertion or Continuance of Line Limited venous access - need for IV therapy >5 days (PICC only) 02/05/2019 12:00 PM  Exposed Catheter (cm) 0 cm 02/05/2019 12:00 PM  Site Assessment Clean;Dry;Intact 02/05/2019 12:00 PM  Lumen #1 Status Flushed;Blood return noted;Saline locked 02/05/2019 12:00 PM  Lumen #2 Status Flushed;Blood return noted;Saline locked 02/05/2019 12:00 PM  Lumen #3 Status Flushed;Blood return noted;Saline locked 02/05/2019 12:00 PM  Dressing Type Transparent 02/05/2019 12:00 PM  Dressing Status Clean;Dry;Intact;Antimicrobial disc in place 02/05/2019 12:00 PM  Line Care Connections checked and tightened 02/05/2019 12:00 PM  Line Adjustment (NICU/IV Team Only) No 02/05/2019 12:00 PM  Dressing Intervention New dressing 02/05/2019 12:00 PM  Dressing Change Due 02/12/19 02/05/2019 12:00 PM       Rolena Infante 02/05/2019, 1:01 PM

## 2019-02-05 NOTE — Consult Note (Signed)
Name: [name] [name] MRN: [id number] DOB: [birth year]    ADMISSION DATE:  02/03/2019 CONSULTATION DATE:  02/05/19  REFERRING MD :  Amie Portland md  CHIEF COMPLAINT:  weakness  BRIEF PATIENT DESCRIPTION: 85 year old female history of atrial fibrillation on Xarelto, hypertension who presented on 02/03/2019 with an M2 left-sided stroke status post thrombectomy.  Now with seizures and intubated for airway protection.  SIGNIFICANT EVENTS  2/16 seizures, intubated   STUDIES:  MRI 02/04/19: IMPRESSION: 1. Moderate-sized acute nonhemorrhagic ischemic anterior left MCA territory infarct. 2. Few additional small volume cortical infarcts involving the mesial left temporal lobe and left occipital pole, likely related to fetal type left PCA seen on prior CTA. 3. Additional 6 mm acute ischemic nonhemorrhagic right cerebellar infarct. 4. Focus of susceptibility artifact at the base of the left sylvian fissure, suspected to reflect residual and/or recurrent thrombus within a proximal left M2 branch. 5. Underlying atrophy with advanced chronic microvascular ischemic Disease.  [facility] 02/04/19 IMPRESSION: 1. Stable head CT. The subtle edema at the level of the LEFT insular ribbon and LEFT frontal operculum is unchanged in extent, corresponding to the acute MCA infarct described on recent MRI. 2. No parenchymal or extra-axial hemorrhage seen. No significant mass effect, midline shift or herniation.    HISTORY OF PRESENT ILLNESS: 85 year old female history of atrial fibrillation on Xarelto, hypertension who presented on 02/03/2019 with a aphasia and right facial droop and right hemiparesis.  She was noted to have an M2 occlusion for which she underwent thrombectomy by interventional radiology.  She did not receive TPA because she was already on anticoagulation.  In addition cardiology was consulted due to a troponin elevation as well as atrial fibrillation.  She has been maintained on diltiazem drip with good  rate control here.  On 2/15 she was noted to have focal twitching of the right arm which then generalized and lasted 30 seconds.  She was also noted to have a right gaze and minimally responsive.  The symptoms improved after Ativan 1 mg.  She was subsequently loaded with Keppra.  Later in the evening she was noted to be less responsive and CCM was called for endotracheal intubation.  Patient was seen and unresponsive, unable to give any pertinent history.  PAST MEDICAL HISTORY :   has a past medical history of Abnormal EKG, HLD (hyperlipidemia), and HTN (hypertension).  has a past surgical history that includes Cataract extraction (2006); hysterectomy - unknown type (1978); and Hemorrhoid surgery (1971). Prior to Admission medications   Medication Sig Start Date End Date Taking? Authorizing Provider  albuterol (PROVENTIL) (2.5 MG/3ML) 0.083% nebulizer solution Inhale 3 mLs into the lungs every 4 (four) hours as needed for wheezing. 02/02/19   [provider]  amiodarone (PACERONE) 200 MG tablet Take 2 tablets (400 mg total) by mouth daily. Take 400 mg by mouth once daily for 2 weeks, then to 200 mg once daily for 2 weeks, then 100 mg once daily. 01/31/19   Revankar, Reita Cliche, MD  diazepam (VALIUM) 2 MG tablet Take 2 mg by mouth as needed.      [provider]  levothyroxine (SYNTHROID, LEVOTHROID) 75 MCG tablet Take 75 mcg by mouth daily.     [provider]  montelukast (SINGULAIR) 10 MG tablet Take 1 tablet by mouth daily. 04/21/18   [provider]  omeprazole (PRILOSEC) 20 MG capsule Take 20 mg by mouth 2 (two) times daily.      [provider]  Rivaroxaban (XARELTO) 15 MG  TABS tablet Take 1 tablet (15 mg total) by mouth daily. 02/01/19   Revankar, Reita Cliche, MD  simvastatin (ZOCOR) 20 MG tablet Take 20 mg by mouth at bedtime.     [provider]   Allergies  Allergen Reactions  . Amoxicillin-Pot Clavulanate     REACTION: Reaction not known  .  Codeine     REACTION: Reaction not known  . Metoclopramide Hcl     REACTION: Reaction not known  . Morphine     REACTION: Reaction not known  . Nadolol Other (See Comments)    UNKNOWN  . Sulfamethoxazole-Trimethoprim Diarrhea  . Valacyclovir Diarrhea    FAMILY HISTORY:  family history includes Aneurysm in her unknown relative; Cancer in her unknown relative. SOCIAL HISTORY:  reports that she quit smoking about 28 years ago. She has never used smokeless tobacco. She reports that she does not use drugs.  SUBJECTIVE:   REVIEW OF SYSTEMS:   Full Review of Systems negative except otherwise noted as above  VITAL SIGNS: Temp:  [97.9 F (36.6 C)-99.8 F (37.7 C)] 97.9 F (36.6 C) (02/15 2000) Pulse Rate:  [30-189] 143 (02/15 2100) Resp:  [7-27] 18 (02/15 2100) BP: (103-160)/(49-131) 135/84 (02/15 2100) SpO2:  [81 %-100 %] 96 % (02/15 2100) Arterial Line BP: (115-139)/(41-63) 128/63 (02/15 0900)  PHYSICAL EXAMINATION: Physical Exam Constitutional:      Comments: Lying in bed, not responsive, ill appearing  HENT:     Head: Normocephalic and atraumatic.  Eyes:     General: No scleral icterus.       Right eye: No discharge.        Left eye: No discharge.  Neck:     Vascular: No JVD.  Cardiovascular:     Rate and Rhythm: Normal rate.     Heart sounds: No murmur.  Pulmonary:     Effort: Pulmonary effort is normal. No respiratory distress.     Breath sounds: Normal breath sounds. No stridor.  Abdominal:     General: There is no distension.     Palpations: Abdomen is soft.     Tenderness: There is no abdominal tenderness.  Musculoskeletal:        General: No deformity.  Skin:    General: Skin is warm and dry.  Neurological:     Comments: Minimally withdraws to a sternal rub      Recent Labs  Lab 01/31/19 0951 02/03/19 1104 02/03/19 1122 02/04/19 0408  NA 143  --  143 143  K 3.6  --  3.0* 2.9*  CL 101  --  103 110  CO2 26  --  25 20*  BUN 21  --  14 12    CREATININE 1.20* 1.00 1.18* 1.28*  GLUCOSE 102*  --  108* 133*   Recent Labs  Lab 02/03/19 1122 02/04/19 0408  HGB 12.2 9.9*  HCT 39.5 30.8*  WBC 12.3* 16.7*  PLT 182 179   Dg Chest 1 View  Result Date: 02/03/2019 CLINICAL DATA:  Stroke. EXAM: CHEST  1 VIEW COMPARISON:  December 15, 2018 FINDINGS: Stable cardiomegaly. The hila and mediastinum are unremarkable. No pneumothorax. No focal infiltrate in the lungs. IMPRESSION: No active disease. Electronically Signed   By: Dorise Bullion III M.D   On: 02/03/2019 19:35   Ct Head Wo Contrast  Result Date: 02/04/2019 CLINICAL DATA:  Stroke follow-up, less responsive, seizures. EXAM: CT HEAD WITHOUT CONTRAST TECHNIQUE: Contiguous axial images were obtained from the base of the skull through the vertex  without intravenous contrast. COMPARISON:  Head CT dated 02/03/2019 and brain MRI dated 02/04/2019 FINDINGS: Brain: Ventricles are stable in size and configuration. Chronic small vessel ischemic changes again noted within the bilateral periventricular and subcortical white matter regions. Subtle areas of low density are again seen within the region of the LEFT insular ribbon and LEFT frontal operculum, not appreciably changed in extent compared to the earlier head CT, corresponding to the acute infarct demonstrated on subsequent brain MRI. No parenchymal or extra-axial hemorrhage identified. No midline shift or herniation. Vascular: Chronic calcified atherosclerotic changes of the large vessels at the skull base. No unexpected hyperdense vessel. Skull: Normal. Negative for fracture or focal lesion. Sinuses/Orbits: No acute finding. Other: None. IMPRESSION: 1. Stable head CT. The subtle edema at the level of the LEFT insular ribbon and LEFT frontal operculum is unchanged in extent, corresponding to the acute MCA infarct described on recent MRI. 2. No parenchymal or extra-axial hemorrhage seen. No significant mass effect, midline shift or herniation.  Electronically Signed   By: Franki Cabot M.D.   On: 02/04/2019 23:14   Mr Brain Wo Contrast  Result Date: 02/04/2019 CLINICAL DATA:  Follow-up examination for acute stroke. EXAM: MRI HEAD WITHOUT CONTRAST TECHNIQUE: Multiplanar, multiecho pulse sequences of the brain and surrounding structures were obtained without intravenous contrast. COMPARISON:  Prior CT and CTA from 02/03/2019 FINDINGS: Brain: Advanced age-related cerebral atrophy with chronic microvascular ischemic disease. Patchy and confluent restricted diffusion involving the left insula as well as the overlying left frontal operculum and left frontal cortical in underlying subcortical and deep white matter seen, compatible with moderate size acute left MCA territory infarct. No associated hemorrhage or significant mass effect. Focus of susceptibility artifact at the base of the left sylvian fissure suspected to reflect possible residual and/or recurrent thrombus within a left M2 branch. Few additional scattered subcentimeter cortical infarcts seen involving the left occipital pole as well as the posterior left hippocampal formation, likely related to the fetal type left PCA seen on prior CTA. Additional punctate 6 mm right cerebellar nonhemorrhagic infarct noted (series 5, image 56). No mass lesion, midline shift, or significant mass effect. No hydrocephalus. No extra-axial fluid collection. Pituitary gland within normal limits. Midline structures intact. Vascular: Major intracranial vascular flow voids maintained. Left vertebral artery hypoplastic and not well seen. Skull and upper cervical spine: Degenerative thickening at the tectorial membrane with mild narrowing at the craniocervical junction. Upper cervical spine normal. Bone marrow signal intensity within normal limits. No scalp soft tissue abnormality. Sinuses/Orbits: Patient status post bilateral ocular lens replacement. Paranasal sinuses are clear. No significant mastoid effusion. Inner ear  structures normal. Other: None. IMPRESSION: 1. Moderate-sized acute nonhemorrhagic ischemic anterior left MCA territory infarct. 2. Few additional small volume cortical infarcts involving the mesial left temporal lobe and left occipital pole, likely related to fetal type left PCA seen on prior CTA. 3. Additional 6 mm acute ischemic nonhemorrhagic right cerebellar infarct. 4. Focus of susceptibility artifact at the base of the left sylvian fissure, suspected to reflect residual and/or recurrent thrombus within a proximal left M2 branch. 5. Underlying atrophy with advanced chronic microvascular ischemic disease. Electronically Signed   By: Jeannine Boga M.D.   On: 02/04/2019 05:25    ASSESSMENT / PLAN:  85 year old female history of atrial fibrillation on Xarelto, hypertension who presented on 02/03/2019 with an M2 left-sided stroke status post thrombectomy.  Now with seizures and intubated for airway protection.  Metabolic encephalopathy due to stroke, seizures, sedation -intubation notable for  large volume of pooled secretions in the oropharynx, penetrating through the glottis with no gag reflex - fentanyl PRN boluses for RASS 0, will likely require very little sedation  S/p intubation for airway protection - low tidal volume ventilation -ventilator protocol - check a blood gas and titrate ventilation accordingly -  Since she was aspirating prior to intubation we will check a tracheal aspirate to have culture data in the case she show signs of pneumonia however currently with clear chest xray, minimal oxygen requirement prior to intubation, no fevers so hold on antibiotics for now  Seizures -Thought to be due to cortical irritation from stroke - on keppra. Further antiepileptics per neurology  Atrial fibrillation with rapid ventricular rate -Well-controlled on diltiazem drip.  Also on oral metoprolol -Managed by cardiology -Stroke thought to be due in part to [place] not being taken  with largest meal of the day   Mali Aaleigha Bozza, MD Pulmonary and [place] Pager: [phone]  02/05/2019, 12:02 AM

## 2019-02-05 NOTE — Plan of Care (Signed)
  Problem: Pain Managment: Goal: General experience of comfort will improve Outcome: Progressing   

## 2019-02-05 NOTE — Progress Notes (Signed)
ANTICOAGULATION CONSULT NOTE  Pharmacy Consult for heparin Indication: atrial fibrillation   Patient Measurements: Heparin Dosing Weight: 58 kg  Vital Signs: Temp: 97.9 F (36.6 C) (02/15 2000) Temp Source: Axillary (02/15 2000) BP: 135/84 (02/15 2100) Pulse Rate: 143 (02/15 2100)  Labs: Recent Labs    02/03/19 1104 02/03/19 1122  02/04/19 0408 02/04/19 1141 02/04/19 2213 02/04/19 2327  HGB  --  12.2  --  9.9*  --   --   --   HCT  --  39.5  --  30.8*  --   --   --   PLT  --  182  --  179  --   --   --   APTT  --  25  --   --   --   --   --   LABPROT  --  13.9  --   --   --   --   --   INR  --  1.08  --   --   --   --   --   HEPARINUNFRC  --   --   --   --   --   --  0.57  CREATININE 1.00 1.18*  --  1.28*  --   --   --   TROPONINI  --   --    < > 0.17* 0.15* 0.13*  --    < > = values in this interval not displayed.    Assessment: Patient with known AFib, on Xarelto prior to admission, admitted with acute MCA stroke, unfortunately, appears to have failed Xarelto. She is s/p mechanical thrombectomy. Planning to start patient on heparin infusion until plan for oral anticoagulation has been decided. Initial heparin level is 0.57 which is slightly elevated for lower goal with acute stroke.   Goal of Therapy:  Heparin level 0.3-0.5 units/ml Monitor platelets by anticoagulation protocol: Yes  Plan:  Decrease heparin gtt to 750 units/hr Check an 8 hr heparin level Daily heparin level and CBC  Damone Fancher, Rande Lawman 02/05/2019,12:15 AM

## 2019-02-05 NOTE — Progress Notes (Signed)
ANTICOAGULATION CONSULT NOTE  Pharmacy Consult for heparin Indication: atrial fibrillation   Patient Measurements: Heparin Dosing Weight: 58 kg  Vital Signs: Temp: 97.6 F (36.4 C) (02/16 1929) Temp Source: Axillary (02/16 1929) BP: 125/76 (02/16 1915) Pulse Rate: 50 (02/16 1915)  Labs: Recent Labs    02/03/19 1122  02/04/19 0408  02/04/19 2213 02/04/19 2327 02/05/19 0134 02/05/19 0213 02/05/19 0536 02/05/19 0935 02/05/19 1811  HGB 12.2  --  9.9*  --   --   --   --  8.8* 9.1*  --   --   HCT 39.5  --  30.8*  --   --   --   --  26.0* 29.9*  --   --   PLT 182  --  179  --   --   --   --   --  161  --   --   APTT 25  --   --   --   --   --   --   --   --   --   --   LABPROT 13.9  --   --   --   --   --   --   --   --   --   --   INR 1.08  --   --   --   --   --   --   --   --   --   --   HEPARINUNFRC  --   --   --   --   --  0.57  --   --   --  0.17* 0.36  CREATININE 1.18*  --  1.28*  --   --   --   --   --  1.38*  --   --   TROPONINI  --    < > 0.17*   < > 0.13*  --  0.14*  --  0.10*  --   --    < > = values in this interval not displayed.    Assessment: Patient with known AFib, on Xarelto prior to admission, admitted with acute MCA stroke, unfortunately, appears to have failed Xarelto. She is s/p mechanical thrombectomy. Planning to start patient on heparin infusion until plan for oral anticoagulation has been decided.  Heparin level recheck is therapeutic at 0.36 on 750 units/hr with Heparin line change to PICC from poor-functioning PIV.  No bleeding noted.   Goal of Therapy:  Heparin level 0.3-0.5 units/ml Monitor platelets by anticoagulation protocol: Yes  Plan:  Continue heparin gtt at 750 units/hr. Daily heparin level and CBC  Sloan Leiter, PharmD, BCPS, BCCCP Clinical Pharmacist Please refer to [facility] for [place] numbers 02/05/2019,8:06 PM

## 2019-02-05 NOTE — Progress Notes (Signed)
CRITICAL CARE  Intubated last night following seizure and possible aspiration.  Stroke with pharyngeal dysfunction. Awake with good head control but unable to assess cough.   Will defer extubation until morning.  Kipp Brood, MD [facility] ICU Physician [place]  Pager: [phone] Mobile: [phone] After hours: [phone].  02/05/2019, 3:35 PM

## 2019-02-05 NOTE — Procedures (Signed)
Intubation Procedure Note [name] [name] [id number] 1933/02/28  Procedure: Intubation Indications: Airway protection and maintenance  Procedure Details Consent: Risks of procedure as well as the alternatives and risks of each were explained to the (patient/caregiver).  Consent for procedure obtained. Time Out: Verified patient identification, verified procedure, site/side was marked, verified correct patient position, special equipment/implants available, medications/allergies/relevent history reviewed, required imaging and test results available.  Performed  Due to her significant encephalopathy, hypotension, and pulmonary hypertension she was fiberoptically intubated with topical lidocaine a total of 180 mg and no induction.  Procedure notable for large volume of lidocaine and secretions pooling in oropharynx, large volume of secretions seen pooling and entering the vocal cords and glottis with minimal to no gag reflex.   Evaluation Hemodynamic Status: BP stable throughout; O2 sats: stable throughout Patient's Current Condition: stable Complications: No apparent complications Patient did tolerate procedure well. Chest X-ray ordered to verify placement.  CXR: tube position acceptable.   Mali Deneka [place] 02/05/2019

## 2019-02-05 NOTE — Progress Notes (Signed)
Progress Note  Patient Name: [name] [name] Date of Encounter: 02/05/2019  Primary Cardiologist: Ravenkar  Subjective   Intubated spoke with husband and nurse   Inpatient Medications    Scheduled Meds: .  stroke: mapping our early stages of recovery book   Does not apply Once  . chlorhexidine  15 mL Mouth Rinse BID  . chlorhexidine gluconate (MEDLINE KIT)  15 mL Mouth Rinse BID  . LORazepam      . mouth rinse  15 mL Mouth Rinse q12n4p  . mouth rinse  15 mL Mouth Rinse 10 times per day  . metoprolol tartrate  25 mg Per Tube Q8H   Continuous Infusions: . sodium chloride Stopped (02/04/19 1820)  . diltiazem (CARDIZEM) infusion 7.5 mg/hr (02/05/19 0213)  . heparin 750 Units/hr (02/05/19 0700)  . phenylephrine     PRN Meds: acetaminophen **OR** acetaminophen (TYLENOL) oral liquid 160 mg/5 mL **OR** acetaminophen, eptifibatide, fentaNYL (SUBLIMAZE) injection, fentaNYL (SUBLIMAZE) injection, metoprolol tartrate, nitroGLYCERIN 1.58m/60ml(25 mcg/ml) - [facility]-IR, senna-docusate   Vital Signs    Vitals:   02/05/19 0600 02/05/19 0700 02/05/19 0756 02/05/19 0800  BP: 139/82 120/85 137/65   Pulse: (!) 126 (!) 25 (!) 110   Resp: 19 16 18    Temp:    97.7 F (36.5 C)  TempSrc:    Axillary  SpO2: 100% 100% 100%   Height:        Intake/Output Summary (Last 24 hours) at 02/05/2019 0904 Last data filed at 02/05/2019 0700 Gross per 24 hour  Intake 1496.05 ml  Output 300 ml  Net 1196.05 ml   Last 3 Weights 02/01/2019 01/31/2019 01/03/2019  Weight (lbs) 128 lb 128 lb 128 lb  Weight (kg) 58.06 kg 58.06 kg 58.06 kg      Telemetry    Aflutter rates 80-96 - Personally Reviewed  ECG    afib rate 118 nonspecific ST/T wave changes  - Personally Reviewed  Physical Exam   Intubated sedated  Chronically ill  HEENT: feeding tube  Neck supple with no adenopathy JVP normal no bruits no thyromegaly Lungs clear with no wheezing and good diaphragmatic motion Heart:  S1/S2 no murmur, no rub,  gallop or click PMI normal Abdomen: benighn, BS positve, no tenderness, no AAA no bruit.  No HSM or HJR Distal pulses intact with no bruits No edema Neuro non-focal Skin warm and dry No muscular weakness   Labs    Chemistry Recent Labs  Lab 01/31/19 0951  02/03/19 1122 02/04/19 0408 02/05/19 0213 02/05/19 0536  NA 143   < > 143 143 141 140  K 3.6  --  3.0* 2.9* 4.0 3.8  CL 101  --  103 110  --  114*  CO2 26  --  25 20*  --  22  GLUCOSE 102*  --  108* 133*  --  108*  BUN 21  --  14 12  --  19  CREATININE 1.20*   < > 1.18* 1.28*  --  1.38*  CALCIUM 8.9  --  8.9 8.1*  --  7.9*  PROT 5.8*  --  6.5  --   --   --   ALBUMIN 3.5*  --  2.9*  --   --   --   AST 15  --  20  --   --   --   ALT 9  --  10  --   --   --   ALKPHOS 71  --  55  --   --   --  BILITOT 0.6  --  1.0  --   --   --   GFRNONAA 42*  --  43* 39*  --  35*  GFRAA 48*  --  49* 45*  --  41*  ANIONGAP  --   --  15 13  --  4*   < > = values in this interval not displayed.     Hematology Recent Labs  Lab 02/03/19 1122 02/04/19 0408 02/05/19 0213 02/05/19 0536  WBC 12.3* 16.7*  --  11.9*  RBC 4.04 3.16*  --  3.03*  HGB 12.2 9.9* 8.8* 9.1*  HCT 39.5 30.8* 26.0* 29.9*  MCV 97.8 97.5  --  98.7  MCH 30.2 31.3  --  30.0  MCHC 30.9 32.1  --  30.4  RDW 14.2 14.5  --  14.6  PLT 182 179  --  161    Cardiac Enzymes Recent Labs  Lab 02/04/19 1141 02/04/19 2213 02/05/19 0134 02/05/19 0536  TROPONINI 0.15* 0.13* 0.14* 0.10*    Recent Labs  Lab 02/03/19 1102  TROPIPOC 0.59*     BNPNo results for input(s): BNP, PROBNP in the last 168 hours.   DDimer No results for input(s): DDIMER in the last 168 hours.   Radiology    Dg Chest 1 View  Result Date: 02/03/2019 CLINICAL DATA:  Stroke. EXAM: CHEST  1 VIEW COMPARISON:  December 15, 2018 FINDINGS: Stable cardiomegaly. The hila and mediastinum are unremarkable. No pneumothorax. No focal infiltrate in the lungs. IMPRESSION: No active disease. Electronically  Signed   By: Dorise Bullion III M.D   On: 02/03/2019 19:35   Ct Head Wo Contrast  Result Date: 02/04/2019 CLINICAL DATA:  Stroke follow-up, less responsive, seizures. EXAM: CT HEAD WITHOUT CONTRAST TECHNIQUE: Contiguous axial images were obtained from the base of the skull through the vertex without intravenous contrast. COMPARISON:  Head CT dated 02/03/2019 and brain MRI dated 02/04/2019 FINDINGS: Brain: Ventricles are stable in size and configuration. Chronic small vessel ischemic changes again noted within the bilateral periventricular and subcortical white matter regions. Subtle areas of low density are again seen within the region of the LEFT insular ribbon and LEFT frontal operculum, not appreciably changed in extent compared to the earlier head CT, corresponding to the acute infarct demonstrated on subsequent brain MRI. No parenchymal or extra-axial hemorrhage identified. No midline shift or herniation. Vascular: Chronic calcified atherosclerotic changes of the large vessels at the skull base. No unexpected hyperdense vessel. Skull: Normal. Negative for fracture or focal lesion. Sinuses/Orbits: No acute finding. Other: None. IMPRESSION: 1. Stable head CT. The subtle edema at the level of the LEFT insular ribbon and LEFT frontal operculum is unchanged in extent, corresponding to the acute MCA infarct described on recent MRI. 2. No parenchymal or extra-axial hemorrhage seen. No significant mass effect, midline shift or herniation. Electronically Signed   By: Franki Cabot M.D.   On: 02/04/2019 23:14   Mr Brain Wo Contrast  Result Date: 02/04/2019 CLINICAL DATA:  Follow-up examination for acute stroke. EXAM: MRI HEAD WITHOUT CONTRAST TECHNIQUE: Multiplanar, multiecho pulse sequences of the brain and surrounding structures were obtained without intravenous contrast. COMPARISON:  Prior CT and CTA from 02/03/2019 FINDINGS: Brain: Advanced age-related cerebral atrophy with chronic microvascular ischemic  disease. Patchy and confluent restricted diffusion involving the left insula as well as the overlying left frontal operculum and left frontal cortical in underlying subcortical and deep white matter seen, compatible with moderate size acute left MCA territory infarct. No associated hemorrhage  or significant mass effect. Focus of susceptibility artifact at the base of the left sylvian fissure suspected to reflect possible residual and/or recurrent thrombus within a left M2 branch. Few additional scattered subcentimeter cortical infarcts seen involving the left occipital pole as well as the posterior left hippocampal formation, likely related to the fetal type left PCA seen on prior CTA. Additional punctate 6 mm right cerebellar nonhemorrhagic infarct noted (series 5, image 56). No mass lesion, midline shift, or significant mass effect. No hydrocephalus. No extra-axial fluid collection. Pituitary gland within normal limits. Midline structures intact. Vascular: Major intracranial vascular flow voids maintained. Left vertebral artery hypoplastic and not well seen. Skull and upper cervical spine: Degenerative thickening at the tectorial membrane with mild narrowing at the craniocervical junction. Upper cervical spine normal. Bone marrow signal intensity within normal limits. No scalp soft tissue abnormality. Sinuses/Orbits: Patient status post bilateral ocular lens replacement. Paranasal sinuses are clear. No significant mastoid effusion. Inner ear structures normal. Other: None. IMPRESSION: 1. Moderate-sized acute nonhemorrhagic ischemic anterior left MCA territory infarct. 2. Few additional small volume cortical infarcts involving the mesial left temporal lobe and left occipital pole, likely related to fetal type left PCA seen on prior CTA. 3. Additional 6 mm acute ischemic nonhemorrhagic right cerebellar infarct. 4. Focus of susceptibility artifact at the base of the left sylvian fissure, suspected to reflect residual  and/or recurrent thrombus within a proximal left M2 branch. 5. Underlying atrophy with advanced chronic microvascular ischemic disease. Electronically Signed   By: Jeannine Boga M.D.   On: 02/04/2019 05:25   Dg Chest Port 1 View  Result Date: 02/05/2019 CLINICAL DATA:  Intubation EXAM: PORTABLE CHEST 1 VIEW COMPARISON:  February 03, 2019 FINDINGS: The heart size is enlarged. Endotracheal tube is identified distal tip 3.2 cm from carina. The aorta is uncoiled. Feeding tube is identified with distal tip in the distal stomach. Both lungs are clear. There is no pneumothorax. The visualized skeletal structures are stable. IMPRESSION: Endotracheal tube is identified in good position. There is no pneumothorax. Cardiomegaly. Electronically Signed   By: Abelardo Diesel M.D.   On: 02/05/2019 00:54   [place] Ekg Site Rite  Result Date: 02/05/2019 If Site Rite image not attached, placement could not be confirmed due to current cardiac rhythm.   Cardiac Studies   TTE EF >65% severe LAE no significant valve disease  02/01/19 Normal myovue EF 74% no ischemia  Patient Profile     Ms. Tetzloff 85 y.o. history of PAF. Was on amiodarone and xarelto 15 mg daily Admitted with large left MCA stroke. Now s/o IR embolectomy. Unfortunately still has dense weakness and aphasia Compliant with low dose 15 mg Xarleto but had not been taking with largest meal of the day. No history of CAD, CHF, bleeding issues. CT with no bleed.  She was found down at home by family and prior to this had no cardiac symptoms Currently NPO on iv cardizem for rate control and not on anticoagulation   Assessment & Plan    1. Afib:  Known paroxysmal afib. Had stroke despite taking Xarelto 15 mg daily Discussed with Dr Leonie Man He indicated they see failures frequently if xarelto no taken with largest meal of the day due to large first pass clearance. Will continue iv cardizem for rate control and PO lopressor per feeding tube  Heparin per neurology  Likely best to change to eliquis on d/c since stroke occurred on xarlto. Can start iv amiodarone if needed once on iv heparin since  she was on PO prior to stroke  TTE shows severe LAE makes maintaining NSR less likely   2. CVA:  Will need swallowing study sever aphasia likely NPO for a while MRI 2/15  with moderate non hemorrhagic left MCA stroke        For questions or updates, please contact [place] Please consult [email or web address] for contact info under        Signed, Jenkins Rouge, MD  02/05/2019, 9:04 AM

## 2019-02-05 NOTE — Progress Notes (Signed)
SLP Cancellation Note  Patient Details Name: Kayra Crowell MRN: [id number] DOB: 1933/02/28   Cancelled treatment:       Reason Eval/Treat Not Completed: Medical issues which prohibited therapy . Pt intubated per chart review. Will follow up when medically ready.  Deneise Lever, [place], CCC-SLP Speech-Language Pathologist Acute Rehabilitation Services Pager: [phone] Office: [phone]   Aliene Altes 02/05/2019, 8:17 AM

## 2019-02-05 NOTE — Progress Notes (Signed)
PT Cancellation Note  Patient Details Name: [name] [name] MRN: [id number] DOB: 07-01-1933   Cancelled Treatment:    Reason Eval/Treat Not Completed: Patient not medically ready. Pt now intubated and medically declining. Pt not appropriate for PT at this time. Awaiting advancement of medically plan. At this time PT order cancelled. Please re-consult if needed in future. Acute PT signing off.  Kittie Plater, PT, DPT Acute Rehabilitation Services Pager #: [phone] Office #: [phone]    Berline Lopes 02/05/2019, 10:26 AM

## 2019-02-06 ENCOUNTER — Inpatient Hospital Stay (HOSPITAL_COMMUNITY): Payer: Medicare Other

## 2019-02-06 ENCOUNTER — Encounter (HOSPITAL_COMMUNITY): Payer: Self-pay | Admitting: Radiology

## 2019-02-06 DIAGNOSIS — G9341 Metabolic encephalopathy: Secondary | ICD-10-CM

## 2019-02-06 DIAGNOSIS — I6602 Occlusion and stenosis of left middle cerebral artery: Secondary | ICD-10-CM

## 2019-02-06 DIAGNOSIS — Z7901 Long term (current) use of anticoagulants: Secondary | ICD-10-CM

## 2019-02-06 LAB — CBC
HCT: 27.1 % — ABNORMAL LOW (ref 36.0–46.0)
Hemoglobin: 8.5 g/dL — ABNORMAL LOW (ref 12.0–15.0)
MCH: 31.4 pg (ref 26.0–34.0)
MCHC: 31.4 g/dL (ref 30.0–36.0)
MCV: 100 fL (ref 80.0–100.0)
Platelets: 153 10*3/uL (ref 150–400)
RBC: 2.71 MIL/uL — ABNORMAL LOW (ref 3.87–5.11)
RDW: 14.5 % (ref 11.5–15.5)
WBC: 10.5 10*3/uL (ref 4.0–10.5)
nRBC: 0 % (ref 0.0–0.2)

## 2019-02-06 LAB — BASIC METABOLIC PANEL
Anion gap: 10 (ref 5–15)
BUN: 23 mg/dL (ref 8–23)
CO2: 16 mmol/L — ABNORMAL LOW (ref 22–32)
Calcium: 7.8 mg/dL — ABNORMAL LOW (ref 8.9–10.3)
Chloride: 113 mmol/L — ABNORMAL HIGH (ref 98–111)
Creatinine, Ser: 1.24 mg/dL — ABNORMAL HIGH (ref 0.44–1.00)
GFR calc Af Amer: 47 mL/min — ABNORMAL LOW (ref >60)
GFR, EST NON AFRICAN AMERICAN: 40 mL/min — AB (ref >60)
GLUCOSE: 83 mg/dL (ref 70–99)
Potassium: 3.9 mmol/L (ref 3.5–5.1)
Sodium: 139 mmol/L (ref 135–145)

## 2019-02-06 LAB — MAGNESIUM: Magnesium: 1.6 mg/dL — ABNORMAL LOW (ref 1.7–2.4)

## 2019-02-06 LAB — HEMOGLOBIN A1C
Hgb A1c MFr Bld: 5.1 % (ref 4.8–5.6)
Mean Plasma Glucose: 100 mg/dL

## 2019-02-06 LAB — GLUCOSE, CAPILLARY: Glucose-Capillary: 125 mg/dL — ABNORMAL HIGH (ref 70–99)

## 2019-02-06 LAB — HEPARIN LEVEL (UNFRACTIONATED): Heparin Unfractionated: 0.52 IU/mL (ref 0.30–0.70)

## 2019-02-06 LAB — PHOSPHORUS: Phosphorus: 3.1 mg/dL (ref 2.5–4.6)

## 2019-02-06 MED ORDER — PRO-STAT SUGAR FREE PO LIQD
30.0000 mL | Freq: Every day | ORAL | Status: DC
  Administered 2019-02-06 – 2019-02-07 (×2): 30 mL
  Filled 2019-02-06 (×2): qty 30

## 2019-02-06 MED ORDER — ADULT MULTIVITAMIN W/MINERALS CH
1.0000 | ORAL_TABLET | Freq: Every day | ORAL | Status: DC
  Administered 2019-02-06 – 2019-02-07 (×2): 1
  Filled 2019-02-06 (×2): qty 1

## 2019-02-06 MED ORDER — OSMOLITE 1.2 CAL PO LIQD
1000.0000 mL | ORAL | Status: DC
  Administered 2019-02-06: 1000 mL
  Filled 2019-02-06 (×2): qty 1000

## 2019-02-06 MED ORDER — SODIUM CHLORIDE 0.9 % IV BOLUS
500.0000 mL | Freq: Once | INTRAVENOUS | Status: AC
  Administered 2019-02-06: 500 mL via INTRAVENOUS

## 2019-02-06 NOTE — Progress Notes (Addendum)
The patient has been seen in conjunction with Reino Bellis, NP. All aspects of care have been considered and discussed. The patient has been personally interviewed, examined, and all clinical data has been reviewed.   Agree with contents of this note.  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  Apixaban is recommended when patient able to take oral therapy.  Dose adjustment should be made based upon age/weight/kidney function (GFR is greater than 30).    Progress Note  Patient Name: [name] [name] Date of Encounter: 02/06/2019  Primary Cardiologist: Johnsie Cancel  Subjective   Remains intubated but opens her eyes to verbal stimuli.   Inpatient Medications    Scheduled Meds: .  stroke: mapping our early stages of recovery book   Does not apply Once  . chlorhexidine  15 mL Mouth Rinse BID  . chlorhexidine gluconate (MEDLINE KIT)  15 mL Mouth Rinse BID  . Chlorhexidine Gluconate Cloth  6 each Topical Daily  . mouth rinse  15 mL Mouth Rinse q12n4p  . mouth rinse  15 mL Mouth Rinse 10 times per day  . metoprolol tartrate  25 mg Per Tube Q8H  . sodium chloride flush  10-40 mL Intracatheter Q12H   Continuous Infusions: . sodium chloride 75 mL/hr at 02/06/19 1000  . sodium chloride Stopped (02/06/19 0953)  . diltiazem (CARDIZEM) infusion Stopped (02/05/19 1800)  . heparin 750 Units/hr (02/06/19 1000)  . levETIRAcetam Stopped (02/06/19 0443)  . sodium chloride 500 mL (02/06/19 0948)   PRN Meds: sodium chloride, acetaminophen **OR** acetaminophen (TYLENOL) oral liquid 160 mg/5 mL **OR** acetaminophen, eptifibatide, fentaNYL (SUBLIMAZE) injection, fentaNYL (SUBLIMAZE) injection, metoprolol tartrate, nitroGLYCERIN 1.96m/60ml(25 mcg/ml) - [facility]-IR, senna-docusate, sodium chloride flush   Vital Signs    Vitals:   02/06/19 0800 02/06/19 0858 02/06/19 0900 02/06/19 1000  BP:  138/85 (!) 151/83 (!) 145/85  Pulse:  67 83   Resp:  12 12 14   Temp: 97.7 F (36.5 C)     TempSrc: Axillary      SpO2:  99% 99%   Height:        Intake/Output Summary (Last 24 hours) at 02/06/2019 1032 Last data filed at 02/06/2019 1000 Gross per 24 hour  Intake 2705.41 ml  Output 195 ml  Net 2510.41 ml   Last 3 Weights 02/01/2019 01/31/2019 01/03/2019  Weight (lbs) 128 lb 128 lb 128 lb  Weight (kg) 58.06 kg 58.06 kg 58.06 kg      Telemetry    Afib rate controlled - Personally Reviewed  ECG    N/a - Personally Reviewed  Physical Exam   GEN: intubated  Neck: No JVD Cardiac: Irreg Irreg, no murmurs, rubs, or gallops.  Respiratory: Course bilateral breath sounds GI: Soft, nontender, non-distended  MS: generalized edema; No deformity. Neuro:  Nonfocal   Labs    Chemistry Recent Labs  Lab 01/31/19 0951  02/03/19 1122 02/04/19 0408 02/05/19 0213 02/05/19 0536 02/06/19 0550  NA 143   < > 143 143 141 140 139  K 3.6  --  3.0* 2.9* 4.0 3.8 3.9  CL 101  --  103 110  --  114* 113*  CO2 26  --  25 20*  --  22 16*  GLUCOSE 102*   < > 108* 133*  --  108* 83  BUN 21   < > 14 12  --  19 23  CREATININE 1.20*   < > 1.18* 1.28*  --  1.38* 1.24*  CALCIUM 8.9  --  8.9 8.1*  --  7.9* 7.8*  PROT 5.8*  --  6.5  --   --   --   --   ALBUMIN 3.5*  --  2.9*  --   --   --   --   AST 15  --  20  --   --   --   --   ALT 9  --  10  --   --   --   --   ALKPHOS 71  --  55  --   --   --   --   BILITOT 0.6  --  1.0  --   --   --   --   GFRNONAA 42*  --  43* 39*  --  35* 40*  GFRAA 48*  --  49* 45*  --  41* 47*  ANIONGAP  --    < > 15 13  --  4* 10   < > = values in this interval not displayed.     Hematology Recent Labs  Lab 02/04/19 0408 02/05/19 0213 02/05/19 0536 02/06/19 0550  WBC 16.7*  --  11.9* 10.5  RBC 3.16*  --  3.03* 2.71*  HGB 9.9* 8.8* 9.1* 8.5*  HCT 30.8* 26.0* 29.9* 27.1*  MCV 97.5  --  98.7 100.0  MCH 31.3  --  30.0 31.4  MCHC 32.1  --  30.4 31.4  RDW 14.5  --  14.6 14.5  PLT 179  --  161 153    Cardiac Enzymes Recent Labs  Lab 02/04/19 1141 02/04/19 2213  02/05/19 0134 02/05/19 0536  TROPONINI 0.15* 0.13* 0.14* 0.10*    Recent Labs  Lab 02/03/19 1102  TROPIPOC 0.59*     BNPNo results for input(s): BNP, PROBNP in the last 168 hours.   DDimer No results for input(s): DDIMER in the last 168 hours.   Radiology    Ct Head Wo Contrast  Result Date: 02/04/2019 CLINICAL DATA:  Stroke follow-up, less responsive, seizures. EXAM: CT HEAD WITHOUT CONTRAST TECHNIQUE: Contiguous axial images were obtained from the base of the skull through the vertex without intravenous contrast. COMPARISON:  Head CT dated 02/03/2019 and brain MRI dated 02/04/2019 FINDINGS: Brain: Ventricles are stable in size and configuration. Chronic small vessel ischemic changes again noted within the bilateral periventricular and subcortical white matter regions. Subtle areas of low density are again seen within the region of the LEFT insular ribbon and LEFT frontal operculum, not appreciably changed in extent compared to the earlier head CT, corresponding to the acute infarct demonstrated on subsequent brain MRI. No parenchymal or extra-axial hemorrhage identified. No midline shift or herniation. Vascular: Chronic calcified atherosclerotic changes of the large vessels at the skull base. No unexpected hyperdense vessel. Skull: Normal. Negative for fracture or focal lesion. Sinuses/Orbits: No acute finding. Other: None. IMPRESSION: 1. Stable head CT. The subtle edema at the level of the LEFT insular ribbon and LEFT frontal operculum is unchanged in extent, corresponding to the acute MCA infarct described on recent MRI. 2. No parenchymal or extra-axial hemorrhage seen. No significant mass effect, midline shift or herniation. Electronically Signed   By: Franki Cabot M.D.   On: 02/04/2019 23:14   Dg Chest Port 1 View  Result Date: 02/06/2019 CLINICAL DATA:  Respiratory failure EXAM: PORTABLE CHEST 1 VIEW COMPARISON:  Yesterday FINDINGS: Endotracheal tube tip between the clavicular heads  and carina. Feeding tube with tip at the distal stomach or proximal duodenum. Right upper extremity PICC with tip in good position.  Streaky density at the bases. No edema, effusion, or pneumothorax. Known smoothly contoured nodule over the left chest as described on December 2019 chest CT. IMPRESSION: 1. Stable hardware positioning. 2. Streaky densities at the bases favoring atelectasis. Electronically Signed   By: Monte Fantasia M.D.   On: 02/06/2019 08:15   Dg Chest Port 1 View  Result Date: 02/05/2019 CLINICAL DATA:  Bedside PICC placement. Ventilator dependent respiratory failure. EXAM: PORTABLE CHEST 1 VIEW COMPARISON:  Portable chest x-ray earlier same day at 12:35 a.m., 02/03/2019 and previously. FINDINGS: RIGHT arm PICC tip projects at or near the expected location of the cavoatrial junction. Endotracheal tube tip in satisfactory position approximately 3 cm above the carina. Feeding tube courses below the diaphragm with its tip likely in the region of the gastric antrum or duodenal bulb, unchanged. Stable moderate cardiomegaly. Stable mild bibasilar atelectasis. No new pulmonary parenchymal abnormalities. IMPRESSION: 1. RIGHT arm PICC tip projects at or near the cavoatrial junction. 2. Remaining support apparatus also satisfactory. 3. Stable cardiomegaly. Stable mild bibasilar atelectasis. No new abnormalities. Electronically Signed   By: Evangeline Dakin M.D.   On: 02/05/2019 14:06   Dg Chest Port 1 View  Result Date: 02/05/2019 CLINICAL DATA:  Intubation EXAM: PORTABLE CHEST 1 VIEW COMPARISON:  February 03, 2019 FINDINGS: The heart size is enlarged. Endotracheal tube is identified distal tip 3.2 cm from carina. The aorta is uncoiled. Feeding tube is identified with distal tip in the distal stomach. Both lungs are clear. There is no pneumothorax. The visualized skeletal structures are stable. IMPRESSION: Endotracheal tube is identified in good position. There is no pneumothorax. Cardiomegaly.  Electronically Signed   By: Abelardo Diesel M.D.   On: 02/05/2019 00:54   [place] Ekg Site Rite  Result Date: 02/05/2019 If Site Rite image not attached, placement could not be confirmed due to current cardiac rhythm.   Cardiac Studies   TTE: 02/04/2019  IMPRESSIONS    1. The left ventricle has hyperdynamic systolic function, with an ejection fraction of >65%. The cavity size was normal. There is severe asymmetric left ventricular hypertrophy. Left ventricular diastology could not be evaluated secondary to atrial  fibrillation. No evidence of left ventricular regional wall motion abnormalities.  2. The right ventricle has normal systolic function. The cavity was normal. There is no increase in right ventricular wall thickness. Right ventricular systolic pressure is moderately elevated with an estimated pressure of 45.9 mmHg.  3. Left atrial size was severely dilated.  4. Right atrial size was mildly dilated.  5. The mitral valve is normal in structure. There is mild mitral annular calcification present. Mitral valve regurgitation is mild to moderate by color flow Doppler.  6. The tricuspid valve is normal in structure. Tricuspid valve regurgitation is moderate.  7. The aortic valve is tricuspid Mild sclerosis of the aortic valve.  8. The pulmonic valve was normal in structure.  9. The inferior vena cava was normal in size with <50% respiratory variability. 10. No evidence of left ventricular regional wall motion abnormalities. 11. Right atrial pressure is estimated at 8 mmHg.  Patient Profile     85 y.o. female with history of PAF. Was on amiodarone and xarelto 15 mg daily prior to admission. Admitted with large left MCA stroke. Now s/p IR embolectomy. Unfortunately still has dense weakness and aphasia. No history of CAD, CHF, bleeding issues. She was found down at home by family and prior to this had no cardiac symptoms.   Assessment & Plan  1. Afib: rate controlled on metoprolol.  Currently on IV heparin. Husband reports she was taking her Xarelto at breakfast as this was her biggest meal of the day, and usually did not eat in the evenings. May need to switch to Eliquis given stroke while on Xarelto and compliant.  -- echo with severely dilated LA, and moderately dilated RA.  2. Acute respiratory failure: was extubated and the seized requiring re-intubation. Hopefull to extubate today and transition from ICU.   3. HL: on statin, LDL 63   For questions or updates, please contact [place] Please consult [email or web address] for contact info under        Signed, Reino Bellis, NP  02/06/2019, 10:32 AM

## 2019-02-06 NOTE — Progress Notes (Signed)
SLP Cancellation Note  Patient Details Name: [name] [name] MRN: [id number] DOB: March 02, 1933   Cancelled treatment:       Reason Eval/Treat Not Completed: Medical issues which prohibited therapy. (Pt currently intubated. Possible extubation today. Will continue efforts.)  Adithya Difrancesco B. Quentin [facility], CCC-SLP Speech Language Pathologist [phone]  Shonna Chock 02/06/2019, 8:52 AM

## 2019-02-06 NOTE — Progress Notes (Signed)
STROKE TEAM PROGRESS NOTE     SUBJECTIVE (INTERVAL HISTORY)  Patient  Is awake and alert. She is still intubated. She follows simple midline commands only. She is able to move left side purposefully.[name] [name] Her husband and daughter are  at the bedside.  OBJECTIVE Vitals:   02/06/19 0900 02/06/19 1000 02/06/19 1100 02/06/19 1200  BP: (!) 151/83 (!) 145/85 (!) 148/86 126/86  Pulse: 83   (!) 58  Resp: 12 14 11 13   Temp:    98.3 F (36.8 C)  TempSrc:    Axillary  SpO2: 99%   98%  Height:        CBC:  Recent Labs  Lab 02/03/19 1122 02/04/19 0408  02/05/19 0536 02/06/19 0550  WBC 12.3* 16.7*  --  11.9* 10.5  NEUTROABS 10.4* 15.2*  --   --   --   HGB 12.2 9.9*   < > 9.1* 8.5*  HCT 39.5 30.8*   < > 29.9* 27.1*  MCV 97.8 97.5  --  98.7 100.0  PLT 182 179  --  161 153   < > = values in this interval not displayed.    Basic Metabolic Panel:  Recent Labs  Lab 01/31/19 0951  02/05/19 0536 02/06/19 0550  NA 143   < > 140 139  K 3.6   < > 3.8 3.9  CL 101   < > 114* 113*  CO2 26   < > 22 16*  GLUCOSE 102*   < > 108* 83  BUN 21   < > 19 23  CREATININE 1.20*   < > 1.38* 1.24*  CALCIUM 8.9   < > 7.9* 7.8*  MG 1.5*  --  1.7  --    < > = values in this interval not displayed.    Lipid Panel:     Component Value Date/Time   CHOL 108 02/04/2019 1141   TRIG 77 02/04/2019 1141   HDL 30 (L) 02/04/2019 1141   CHOLHDL 3.6 02/04/2019 1141   VLDL 15 02/04/2019 1141   LDLCALC 63 02/04/2019 1141   HgbA1c:  Lab Results  Component Value Date   HGBA1C 5.1 02/04/2019   Urine Drug Screen:     Component Value Date/Time   LABOPIA NONE DETECTED 02/04/2019 0012   COCAINSCRNUR NONE DETECTED 02/04/2019 0012   LABBENZ NONE DETECTED 02/04/2019 0012   AMPHETMU NONE DETECTED 02/04/2019 0012   THCU NONE DETECTED 02/04/2019 0012   LABBARB NONE DETECTED 02/04/2019 0012    Alcohol Level     Component Value Date/Time   ETH <10 02/03/2019 1122    IMAGING  Dg Chest 1 View 02/03/2019  IMPRESSION:  No active disease.    Mr Brain Wo Contrast 02/04/2019 IMPRESSION:  1. Moderate-sized acute nonhemorrhagic ischemic anterior left MCA territory infarct.  2. Few additional small volume cortical infarcts involving the mesial left temporal lobe and left occipital pole, likely related to fetal type left PCA seen on prior CTA.  3. Additional 6 mm acute ischemic nonhemorrhagic right cerebellar infarct.  4. Focus of susceptibility artifact at the base of the left sylvian fissure, suspected to reflect residual and/or recurrent thrombus within a proximal left M2 branch.  5. Underlying atrophy with advanced chronic microvascular ischemic disease.    Cerebral Angiogram and Intervention S/P Lt common carotid arterriogram followed by complete revascularization of occluded dominant inf division with x 2 passes with 58mm x 46mm embotrap retriever dice achieving a TICI 2 b revascularization   Transthoracic Echocardiogram  Ejection  fraction is greater than 65%. Severe asymmetric left ventricular hypertrophy.    PHYSICAL EXAM Blood pressure 126/86, pulse (!) 58, temperature 98.3 F (36.8 C), temperature source Axillary, resp. rate 13, height 5' (1.524 m), SpO2 98 %. Elderly Caucasian lady who is intubated and sedated, not in distress. . Afebrile. Head is nontraumatic. Neck is supple without bruit.    Cardiac exam no murmur or gallop. Lungs are clear to auscultation. Distal pulses are well felt.  Neurological Exam :  She is intubated, awake interactive  But remains. globally aphasic and does not follow commands consistently  She has left gaze preference but will follow gaze to the right past midline. She blinks to threat on the left but less on the right. Right lower facial weakness. Tongue midline. Motor system exam able to move left upper and lower extremities spontaneously and purposefully. Withdraws from the right lower extremity greater than upper extremity to painful stimuli. Deep tendon  reflexes symmetric. Sensation appears intact bilaterally. Plantars downgoing. Gait not tested.     ASSESSMENT/PLAN Ms. Audi Conover is a 85 y.o. female with history of hypertension, hyperlipidemia, atrial fibrillation on Xarelto presenting with aphasia, right facial droop and right hemiparesis. She did not receive IV t-PA due to anticoagulation. M2 occlusion with mechanical thrombectomy in IR.  Stroke:  Multiple infarcts including anterior left MCA territory - embolic - history of remote afib.  Resultant  aphasia  CT head - OSH  MRI head - Moderate-sized acute nonhemorrhagic ischemic anterior left MCA territory infarct. Few additional small volume cortical infarcts involving the mesial left temporal lobe and left occipital pole, likely related to fetal type left PCA seen on prior CTA. Additional 6 mm acute ischemic nonhemorrhagic right cerebellar infarct.   MRA head - not performed  CTA H&N - OSH - left M2 occlusion  Carotid Doppler - cerebral angiogram  2D Echo - ejection fraction greater than 65%. No wall motion abnormalities.  LDL - 63 mg percent  HgbA1c - 5.1  UDS - negative  VTE prophylaxis - SCDs and IV heparin  Diet - NPO  Xarelto (rivaroxaban) daily prior to admission, now on heparin IV  Patient counseled to be compliant with her antithrombotic medications  Ongoing aggressive stroke risk factor management  Therapy recommendations:  pending  Disposition:  Pending  Hypertension  Stable . Permissive hypertension (OK if < 220/120) but gradually normalize in 5-7 days . Long-term BP goal normotensive  Hyperlipidemia  Lipid lowering medication PTA: Zocor 20 mg daily  LDL pending, goal < 70  Current lipid lowering medication: none (await LDL)  Continue statin at discharge   Other Stroke Risk Factors  Advanced age  Former cigarette smoker - quit  Atrial fibrillation    Other Active Problems  Hypokalemia - supplement  Hypomagnesemia - recheck in  AM  Atrial fibrillation - IV heparin - Cardiology consult for rate control  Anemia - monitor - Hb 9.9  Leukocytosis - 16.7 (temp 99) (U/A and CXR not C/W infection) monitor  Creatinine - 1.28  Elevated troponin enzymes - trending down - may be due to RVR - continue to monitor - may need cardiology to address (on IV heparin)  Symptomatic seizures on 02/04/19-Keppra started   Hospital day # 3   . She presented with aphasia due to left M2 occlusion and underwent successful mechanical thrombectomy with decay to be revascularization but remains aphasic despite that. Maintain strict blood pressure control   and close neurological monitoring. Continue IV heparin drip for stroke prevention  given her unstable A. Fib. Appreciate cardiology help. Continue Keppra for seizures. Patient's prognosis is guarded as she may need prolonged ventilatory support given the significant aphasia she has likely difficult to protect her airways. I had a long discussion with her husband and daughter at the bedside and answered questions.They understand poor prognosis and agrees to DO NOT RESUSCITATE and would like one-way extubation with no plans for intubation if she gets worse .discuss with Dr. Doyne Keel of critical care medicine who is in agreement hopefully try to extubate her today.[name] KitchenMarland KitchenThis patient is critically ill and at significant risk of neurological worsening, death and care requires constant monitoring of vital signs, hemodynamics,respiratory and cardiac monitoring, extensive review of multiple databases, frequent neurological assessment, discussion with family, other specialists and medical decision making of high complexity.I have made any additions or clarifications directly to the above note.This critical care time does not reflect procedure time, or teaching time or supervisory time of PA/NP/Med Resident etc but could involve care discussion time.  I spent 30 minutes of neurocritical care time  in the care of  this  patient.      Antony Contras, MD Medical Director [facility] Pager: [phone] 02/06/2019 3:23 PM   To contact Stroke Continuity provider, please refer to [email or web address] After hours, contact General Neurology

## 2019-02-06 NOTE — Progress Notes (Signed)
Initial Nutrition Assessment  DOCUMENTATION CODES:   Non-severe (moderate) malnutrition in context of chronic illness  INTERVENTION:   Initiate Osmolite 1.2 @ 45 ml/hr via Cortrak 30 ml Prostat daily MVI daily  Provides: 1396 kcal, 75 grams protein, and 875 ml free water.   Monitor magnesium and phosphorus every 12 hours x 4 occurences. MD to replete as needed, as pt is at risk for refeeding syndrome given moderate malnutrition.   NUTRITION DIAGNOSIS:   Moderate Malnutrition related to chronic illness as evidenced by energy intake < 75% for > or equal to 1 month, mild muscle depletion.  GOAL:   Patient will meet greater than or equal to 90% of their needs  MONITOR:   TF tolerance, I & O's  REASON FOR ASSESSMENT:   Consult Enteral/tube feeding initiation and management  ASSESSMENT:   Pt with PMH of HTN, HLD, afib on xarelto admitted with multiple infarcts L MCA, M2 occlusion s/p thrombectomy in IR. Pt developed seizures after procedure and was re-intubated. Pt extubated 2/17.    Pt discussed during ICU rounds and with RN.  Per daughter pt is a picky eater and has been eating less for the last 6-8 months resulting in a 13 lb weight loss during that time (9% in 6-8 mo) Breakfast: biscuit  Lunch: soup Snacks but doesn't always eat much for dinner, eats more carbs that she used to. Does not like chicken Pt has been less mobile lately. She does have a walker that she uses but is not very active, mostly sits in a chair.   2/17 pt extubated and tolerating, mental status remains the same 2/15 cortrak placed and then repositioned    Medications reviewed  Labs reviewed UOP: 50 ml throughout the day   NUTRITION - FOCUSED PHYSICAL EXAM:    Most Recent Value  Orbital Region  No depletion  Upper Arm Region  No depletion  Thoracic and Lumbar Region  No depletion  Buccal Region  Unable to assess  Temple Region  Mild depletion  Clavicle Bone Region  No depletion  Clavicle  and Acromion Bone Region  Moderate depletion  Scapular Bone Region  Unable to assess  Dorsal Hand  Unable to assess  Patellar Region  No depletion  Anterior Thigh Region  No depletion  Posterior Calf Region  No depletion  Edema (RD Assessment)  Mild  Hair  Reviewed  Eyes  Unable to assess  Mouth  Unable to assess  Skin  Reviewed [bruising ]  Nails  Reviewed       Diet Order:   Diet Order            Diet NPO time specified  Diet effective now              EDUCATION NEEDS:   No education needs have been identified at this time  Skin:  Skin Assessment: Reviewed RN Assessment  Last BM:  unknown  Height:   Ht Readings from Last 1 Encounters:  02/05/19 5' (1.524 m)    Weight:   Wt Readings from Last 1 Encounters:  02/01/19 58.1 kg    Ideal Body Weight:  45.4 kg  BMI:  Body mass index is 25 kg/m.  Estimated Nutritional Needs:   Kcal:  1300-1500  Protein:  69-78 grams  Fluid:  > 1.5 L/day  Maylon Peppers RD, LDN, CNSC [phone] Pager [phone] After Hours Pager

## 2019-02-06 NOTE — Progress Notes (Signed)
OT Cancellation Note and Discharge  Patient Details Name: [name] [name] MRN: [id number] DOB: [birth year]   Cancelled Treatment:    Reason Eval/Treat Not Completed: Patient not medically ready. Spoke with RN and we will sign off and await new order as appropriate.  [name] Circle, OTR/L Acute Rehab Services Pager [phone] Office [phone]     Almon Register 02/06/2019, 10:01 AM

## 2019-02-06 NOTE — Progress Notes (Signed)
ANTICOAGULATION CONSULT NOTE  Pharmacy Consult for heparin Indication: atrial fibrillation   Patient Measurements: Heparin Dosing Weight: 58 kg  Vital Signs: Temp: 98.1 F (36.7 C) (02/17 0400) Temp Source: Axillary (02/17 0400) BP: 134/71 (02/17 0700) Pulse Rate: 61 (02/17 0700)  Labs: Recent Labs    02/03/19 1122  02/04/19 0408  02/04/19 2213  02/05/19 0134 02/05/19 0213 02/05/19 0536 02/05/19 0935 02/05/19 1811 02/06/19 0550  HGB 12.2  --  9.9*  --   --   --   --  8.8* 9.1*  --   --  8.5*  HCT 39.5  --  30.8*  --   --   --   --  26.0* 29.9*  --   --  27.1*  PLT 182  --  179  --   --   --   --   --  161  --   --  153  APTT 25  --   --   --   --   --   --   --   --   --   --   --   LABPROT 13.9  --   --   --   --   --   --   --   --   --   --   --   INR 1.08  --   --   --   --   --   --   --   --   --   --   --   HEPARINUNFRC  --   --   --   --   --    < >  --   --   --  0.17* 0.36 0.52  CREATININE 1.18*  --  1.28*  --   --   --   --   --  1.38*  --   --  1.24*  TROPONINI  --    < > 0.17*   < > 0.13*  --  0.14*  --  0.10*  --   --   --    < > = values in this interval not displayed.    Assessment: Patient with known AFib, on Xarelto prior to admission, admitted with acute MCA stroke, unfortunately, appears to have failed Xarelto. She is s/p mechanical thrombectomy. Planning to start patient on heparin infusion until plan for oral anticoagulation has been decided.  Heparin level recheck is therapeutic at 0.52 on 750 units/hr. No bleeding noted.   Goal of Therapy:  Heparin level 0.3-0.5 units/ml Monitor platelets by anticoagulation protocol: Yes  Plan:  Continue heparin gtt at 750 units/hr. Daily heparin level and CBC   Alanda Slim, PharmD, [facility] Clinical Pharmacist Please see AMION for all Pharmacists' Contact Phone Numbers 02/06/2019, 7:56 AM

## 2019-02-06 NOTE — Progress Notes (Signed)
Referring Physician(s): CODE STROKE- Aroor, Karena Addison  Supervising Physician: Luanne Bras  Patient Status:  [facility] - In-pt  Chief Complaint: None  Subjective:  Left MCA occlusion s/p emergent mechanical thrombectomy achieving a TICI 2b revascularization 02/03/2019 by Dr. Estanislado Pandy. Patient awake laying in bed intubated without sedation. Accompanied by daughter at bedside. She does not follow commands. Can spontaneously move left side, no spontaneous movements of right side. Right groin incision c/d/i.   Allergies: Amoxicillin-pot clavulanate; Codeine; Metoclopramide hcl; Morphine; Nadolol; Sulfamethoxazole-trimethoprim; and Valacyclovir  Medications: Prior to Admission medications   Medication Sig Start Date End Date Taking? Authorizing Provider  albuterol (PROVENTIL) (2.5 MG/3ML) 0.083% nebulizer solution Inhale 3 mLs into the lungs every 4 (four) hours as needed for wheezing. 02/02/19   [provider]  amiodarone (PACERONE) 200 MG tablet Take 2 tablets (400 mg total) by mouth daily. Take 400 mg by mouth once daily for 2 weeks, then to 200 mg once daily for 2 weeks, then 100 mg once daily. 01/31/19   Revankar, Reita Cliche, MD  diazepam (VALIUM) 2 MG tablet Take 2 mg by mouth as needed.      [provider]  levothyroxine (SYNTHROID, LEVOTHROID) 75 MCG tablet Take 75 mcg by mouth daily.     [provider]  montelukast (SINGULAIR) 10 MG tablet Take 1 tablet by mouth daily. 04/21/18   [provider]  omeprazole (PRILOSEC) 20 MG capsule Take 20 mg by mouth 2 (two) times daily.      [provider]  Rivaroxaban (XARELTO) 15 MG TABS tablet Take 1 tablet (15 mg total) by mouth daily. 02/01/19   Revankar, Reita Cliche, MD  simvastatin (ZOCOR) 20 MG tablet Take 20 mg by mouth at bedtime.     [provider]     Vital Signs: BP 138/85   Pulse 67   Temp 97.7 F (36.5 C) (Axillary)   Resp 12   Ht 5' (1.524 m)   SpO2 99%   BMI 25.00  kg/m   Physical Exam Vitals signs and nursing note reviewed.  Constitutional:      General: She is not in acute distress.    Appearance: Normal appearance.     Comments: Intubated without sedation.  Pulmonary:     Effort: Pulmonary effort is normal. No respiratory distress.     Comments: Intubated without sedation. Skin:    General: Skin is warm and dry.     Comments: Right groin incision soft without active bleeding or hematoma.  Neurological:     Mental Status: She is alert.     Comments: Patient awake and intubated without sedation. She does not follow commands. PERRL bilaterally. Unable to assess facial asymmetry due to ET tube. Unable to assess tongue protrusion due to ET tube. Can spontaneously move left side but no spontaneous movements of right side. EOMs, visual fields, pronator drift, fine motor and coordination, gait, romberg, and heel to toe not assessed. Distal pulses 1+ bilaterally.  Psychiatric:     Comments: Intubated without sedation.     Imaging: Dg Chest 1 View  Result Date: 02/03/2019 CLINICAL DATA:  Stroke. EXAM: CHEST  1 VIEW COMPARISON:  December 15, 2018 FINDINGS: Stable cardiomegaly. The hila and mediastinum are unremarkable. No pneumothorax. No focal infiltrate in the lungs. IMPRESSION: No active disease. Electronically Signed   By: Dorise Bullion III M.D   On: 02/03/2019 19:35   Ct Head Wo Contrast  Result Date: 02/04/2019 CLINICAL DATA:  Stroke follow-up, less responsive, seizures.  EXAM: CT HEAD WITHOUT CONTRAST TECHNIQUE: Contiguous axial images were obtained from the base of the skull through the vertex without intravenous contrast. COMPARISON:  Head CT dated 02/03/2019 and brain MRI dated 02/04/2019 FINDINGS: Brain: Ventricles are stable in size and configuration. Chronic small vessel ischemic changes again noted within the bilateral periventricular and subcortical white matter regions. Subtle areas of low density are again seen within the region  of the LEFT insular ribbon and LEFT frontal operculum, not appreciably changed in extent compared to the earlier head CT, corresponding to the acute infarct demonstrated on subsequent brain MRI. No parenchymal or extra-axial hemorrhage identified. No midline shift or herniation. Vascular: Chronic calcified atherosclerotic changes of the large vessels at the skull base. No unexpected hyperdense vessel. Skull: Normal. Negative for fracture or focal lesion. Sinuses/Orbits: No acute finding. Other: None. IMPRESSION: 1. Stable head CT. The subtle edema at the level of the LEFT insular ribbon and LEFT frontal operculum is unchanged in extent, corresponding to the acute MCA infarct described on recent MRI. 2. No parenchymal or extra-axial hemorrhage seen. No significant mass effect, midline shift or herniation. Electronically Signed   By: Franki Cabot M.D.   On: 02/04/2019 23:14   Mr Brain Wo Contrast  Result Date: 02/04/2019 CLINICAL DATA:  Follow-up examination for acute stroke. EXAM: MRI HEAD WITHOUT CONTRAST TECHNIQUE: Multiplanar, multiecho pulse sequences of the brain and surrounding structures were obtained without intravenous contrast. COMPARISON:  Prior CT and CTA from 02/03/2019 FINDINGS: Brain: Advanced age-related cerebral atrophy with chronic microvascular ischemic disease. Patchy and confluent restricted diffusion involving the left insula as well as the overlying left frontal operculum and left frontal cortical in underlying subcortical and deep white matter seen, compatible with moderate size acute left MCA territory infarct. No associated hemorrhage or significant mass effect. Focus of susceptibility artifact at the base of the left sylvian fissure suspected to reflect possible residual and/or recurrent thrombus within a left M2 branch. Few additional scattered subcentimeter cortical infarcts seen involving the left occipital pole as well as the posterior left hippocampal formation, likely related to  the fetal type left PCA seen on prior CTA. Additional punctate 6 mm right cerebellar nonhemorrhagic infarct noted (series 5, image 56). No mass lesion, midline shift, or significant mass effect. No hydrocephalus. No extra-axial fluid collection. Pituitary gland within normal limits. Midline structures intact. Vascular: Major intracranial vascular flow voids maintained. Left vertebral artery hypoplastic and not well seen. Skull and upper cervical spine: Degenerative thickening at the tectorial membrane with mild narrowing at the craniocervical junction. Upper cervical spine normal. Bone marrow signal intensity within normal limits. No scalp soft tissue abnormality. Sinuses/Orbits: Patient status post bilateral ocular lens replacement. Paranasal sinuses are clear. No significant mastoid effusion. Inner ear structures normal. Other: None. IMPRESSION: 1. Moderate-sized acute nonhemorrhagic ischemic anterior left MCA territory infarct. 2. Few additional small volume cortical infarcts involving the mesial left temporal lobe and left occipital pole, likely related to fetal type left PCA seen on prior CTA. 3. Additional 6 mm acute ischemic nonhemorrhagic right cerebellar infarct. 4. Focus of susceptibility artifact at the base of the left sylvian fissure, suspected to reflect residual and/or recurrent thrombus within a proximal left M2 branch. 5. Underlying atrophy with advanced chronic microvascular ischemic disease. Electronically Signed   By: Jeannine Boga M.D.   On: 02/04/2019 05:25   Dg Chest Port 1 View  Result Date: 02/06/2019 CLINICAL DATA:  Respiratory failure EXAM: PORTABLE CHEST 1 VIEW COMPARISON:  Yesterday FINDINGS: Endotracheal tube tip  between the clavicular heads and carina. Feeding tube with tip at the distal stomach or proximal duodenum. Right upper extremity PICC with tip in good position. Streaky density at the bases. No edema, effusion, or pneumothorax. Known smoothly contoured nodule over  the left chest as described on December 2019 chest CT. IMPRESSION: 1. Stable hardware positioning. 2. Streaky densities at the bases favoring atelectasis. Electronically Signed   By: Monte Fantasia M.D.   On: 02/06/2019 08:15   Dg Chest Port 1 View  Result Date: 02/05/2019 CLINICAL DATA:  Bedside PICC placement. Ventilator dependent respiratory failure. EXAM: PORTABLE CHEST 1 VIEW COMPARISON:  Portable chest x-ray earlier same day at 12:35 a.m., 02/03/2019 and previously. FINDINGS: RIGHT arm PICC tip projects at or near the expected location of the cavoatrial junction. Endotracheal tube tip in satisfactory position approximately 3 cm above the carina. Feeding tube courses below the diaphragm with its tip likely in the region of the gastric antrum or duodenal bulb, unchanged. Stable moderate cardiomegaly. Stable mild bibasilar atelectasis. No new pulmonary parenchymal abnormalities. IMPRESSION: 1. RIGHT arm PICC tip projects at or near the cavoatrial junction. 2. Remaining support apparatus also satisfactory. 3. Stable cardiomegaly. Stable mild bibasilar atelectasis. No new abnormalities. Electronically Signed   By: Evangeline Dakin M.D.   On: 02/05/2019 14:06   Dg Chest Port 1 View  Result Date: 02/05/2019 CLINICAL DATA:  Intubation EXAM: PORTABLE CHEST 1 VIEW COMPARISON:  February 03, 2019 FINDINGS: The heart size is enlarged. Endotracheal tube is identified distal tip 3.2 cm from carina. The aorta is uncoiled. Feeding tube is identified with distal tip in the distal stomach. Both lungs are clear. There is no pneumothorax. The visualized skeletal structures are stable. IMPRESSION: Endotracheal tube is identified in good position. There is no pneumothorax. Cardiomegaly. Electronically Signed   By: Abelardo Diesel M.D.   On: 02/05/2019 00:54   [place] Ekg Site Rite  Result Date: 02/05/2019 If Site Rite image not attached, placement could not be confirmed due to current cardiac rhythm.   Labs:  CBC: Recent  Labs    02/03/19 1122 02/04/19 0408 02/05/19 0213 02/05/19 0536 02/06/19 0550  WBC 12.3* 16.7*  --  11.9* 10.5  HGB 12.2 9.9* 8.8* 9.1* 8.5*  HCT 39.5 30.8* 26.0* 29.9* 27.1*  PLT 182 179  --  161 153    COAGS: Recent Labs    02/03/19 1122  INR 1.08  APTT 25    BMP: Recent Labs    02/03/19 1122 02/04/19 0408 02/05/19 0213 02/05/19 0536 02/06/19 0550  NA 143 143 141 140 139  K 3.0* 2.9* 4.0 3.8 3.9  CL 103 110  --  114* 113*  CO2 25 20*  --  22 16*  GLUCOSE 108* 133*  --  108* 83  BUN 14 12  --  19 23  CALCIUM 8.9 8.1*  --  7.9* 7.8*  CREATININE 1.18* 1.28*  --  1.38* 1.24*  GFRNONAA 43* 39*  --  35* 40*  GFRAA 49* 45*  --  41* 47*    LIVER FUNCTION TESTS: Recent Labs    01/31/19 0951 02/03/19 1122  BILITOT 0.6 1.0  AST 15 20  ALT 9 10  ALKPHOS 71 55  PROT 5.8* 6.5  ALBUMIN 3.5* 2.9*    Assessment and Plan:  Left MCA occlusion s/p emergent mechanical thrombectomy achieving a TICI 2b revascularization 02/03/2019 by Dr. Estanislado Pandy. Patient's condition stable- she remains intubated without sedation, can spontaneously move left side but no spontaneous movements  of right side. Plan for possible extubation today. Appreciate and agree with neurology and CCM management. Please call IR with questions/concerns.   Electronically Signed: Earley Abide, PA-C 02/06/2019, 9:11 AM   I spent a total of 25 Minutes at the the patient's bedside AND on the patient's hospital floor or unit, greater than 50% of which was counseling/coordinating care for left MCA occlusion s/p revascularization.

## 2019-02-06 NOTE — Procedures (Signed)
Extubation Procedure Note  Patient Details:   Name: [name] [name] DOB: 05-12-1933 MRN: [id number]   Airway Documentation:    Vent end date: 02/06/19 Vent end time: 1210   Evaluation  O2 sats: stable throughout Complications: No apparent complications Patient did tolerate procedure well. Bilateral Breath Sounds: Clear, Diminished   No   Pt extubated to 4L [place]. No stridor noted. Pt is non verbal. RN @ bedside.   Donnetta Hail 02/06/2019, 12:12 PM

## 2019-02-06 NOTE — Progress Notes (Signed)
NAME:  [name] [name], MRN:  [id number], DOB:  Jul 06, 1933, LOS: 3 ADMISSION DATE:  02/03/2019, CONSULTATION DATE: 02/05/2019 REFERRING MD: Carson Myrtle, CHIEF COMPLAINT: Respiratory failure  HPI/course in hospital  85 year old female history of atrial fibrillation on Xarelto, hypertension who presented on 02/03/2019 with an M2 left-sided stroke status post thrombectomy.  Now with seizures and intubated for airway protection.  Presented 2/14 with aphasia, right facial droop and right hemiparesis.  Underwent thrombectomy of left M2 occlusion. Started on heparin and diltiazem for atrial fibrillation. 2/15 focal right arm twitching with secondary generalization lasting 30 seconds.  Given Ativan and Keppra and intubated for airway protection.  2/16 more awake but poor cough precluded extubation later in the day.  Past Medical History   Past Medical History:  Diagnosis Date  . Abnormal EKG   . HLD (hyperlipidemia)   . HTN (hypertension)      Past Surgical History:  Procedure Laterality Date  . CATARACT EXTRACTION  2006  . [place]  . hysterectomy - unknown type  1978    Interim history/subjective:  Received fentanyl once overnight.  Otherwise as awake as she has been since her stroke. Had initially tolerated extubation from a respiratory standpoint until he she seized.  Objective   Blood pressure 134/71, pulse 61, temperature 97.7 F (36.5 C), temperature source Axillary, resp. rate 16, height 5' (1.524 m), SpO2 99 %.    Vent Mode: PRVC FiO2 (%):  [30 %-40 %] 30 % Set Rate:  [16 bmp] 16 bmp Vt Set:  [360 mL] 360 mL PEEP:  [5 cmH20] 5 cmH20 Pressure Support:  [5 cmH20] 5 cmH20 Plateau Pressure:  [14 cmH20-18 cmH20] 18 cmH20   Intake/Output Summary (Last 24 hours) at 02/06/2019 0838 Last data filed at 02/06/2019 0600 Gross per 24 hour  Intake 1928.99 ml  Output 195 ml  Net 1733.99 ml   There were no vitals filed for this visit.  Examination: Physical Exam    Constitutional: No distress. She is intubated.  HENT:  Endotracheal tube in place with no skin breakdown  Eyes: Pupils are equal, round, and reactive to light.  Neck: Neck supple. No JVD present.  Cardiovascular: Normal heart sounds. An irregularly irregular rhythm present. Tachycardia present.  Respiratory: Breath sounds normal. She is intubated. No respiratory distress.  Tolerating pressure support ventilation  GI: Soft. There is no abdominal tenderness.  Genitourinary:    Genitourinary Comments: Foley catheter in place for retention   Musculoskeletal:        General: Edema present.  Neurological: She is alert. GCS eye subscore is 4. GCS verbal subscore is 1. GCS motor subscore is 5.  No facial asymmetry, moves left side preferentially  Skin: Abrasion, bruising and ecchymosis noted.  Marked skin fragility  Psychiatric: Her affect is blunt. She is slowed.     Ancillary tests (personally reviewed)  CBC: Recent Labs  Lab 02/03/19 1122 02/04/19 0408 02/05/19 0213 02/05/19 0536 02/06/19 0550  WBC 12.3* 16.7*  --  11.9* 10.5  NEUTROABS 10.4* 15.2*  --   --   --   HGB 12.2 9.9* 8.8* 9.1* 8.5*  HCT 39.5 30.8* 26.0* 29.9* 27.1*  MCV 97.8 97.5  --  98.7 100.0  PLT 182 179  --  161 355    Basic Metabolic Panel: Recent Labs  Lab 01/31/19 0951 02/03/19 1104 02/03/19 1122 02/04/19 0408 02/05/19 0213 02/05/19 0536 02/06/19 0550  NA 143  --  143 143 141 140 139  K 3.6  --  3.0* 2.9* 4.0 3.8 3.9  CL 101  --  103 110  --  114* 113*  CO2 26  --  25 20*  --  22 16*  GLUCOSE 102*  --  108* 133*  --  108* 83  BUN 21  --  14 12  --  19 23  CREATININE 1.20* 1.00 1.18* 1.28*  --  1.38* 1.24*  CALCIUM 8.9  --  8.9 8.1*  --  7.9* 7.8*  MG 1.5*  --   --   --   --  1.7  --    GFR: Estimated Creatinine Clearance: 27.4 mL/min (A) (by C-G formula based on SCr of 1.24 mg/dL (H)). Recent Labs  Lab 02/03/19 1122 02/04/19 0408 02/05/19 0536 02/06/19 0550  WBC 12.3* 16.7* 11.9* 10.5     Liver Function Tests: Recent Labs  Lab 01/31/19 0951 02/03/19 1122  AST 15 20  ALT 9 10  ALKPHOS 71 55  BILITOT 0.6 1.0  PROT 5.8* 6.5  ALBUMIN 3.5* 2.9*   No results for input(s): LIPASE, AMYLASE in the last 168 hours. No results for input(s): AMMONIA in the last 168 hours.  ABG    Component Value Date/Time   PHART 7.425 02/05/2019 0213   PCO2ART 31.6 (L) 02/05/2019 0213   PO2ART 144.0 (H) 02/05/2019 0213   HCO3 20.8 02/05/2019 0213   TCO2 22 02/05/2019 0213   ACIDBASEDEF 3.0 (H) 02/05/2019 0213   O2SAT 99.0 02/05/2019 0213     Coagulation Profile: Recent Labs  Lab 02/03/19 1122  INR 1.08    Cardiac Enzymes: Recent Labs  Lab 02/04/19 0408 02/04/19 1141 02/04/19 2213 02/05/19 0134 02/05/19 0536  TROPONINI 0.17* 0.15* 0.13* 0.14* 0.10*    HbA1C: No results found for: HGBA1C  CBG: No results for input(s): GLUCAP in the last 168 hours.  CXR: Tube in good position.  No airspace disease.  Stable left lung hamartoma previously described (personal interpretation)  Assessment & Plan:  Critically ill due to respiratory failure following seizure require mechanical ventilation. Status post thrombectomy for left M2 occlusion CVA Seizure secondary to recent stroke Atrial fibrillation requiring titration of diltiazem infusion to maintain rate control Relative oliguria.  PLAN:   Proceed to SBT today with potential extubation At high risk for swallowing dysfunction given prior aphasia Continue anticoagulation for atrial fibrillation Once extubated transition to [place] secondary stroke prevention Initiate post stroke physical therapy. Continue anticonvulsants indefinitely. Fluid challenge  Best practice:  Diet: Nasogastric feeding followed by swallow evaluation Pain/Anxiety/Delirium protocol (if indicated): Sedation off VAP protocol (if indicated): Bundle in place DVT prophylaxis: Full anticoagulation GI prophylaxis: Protonix Glucose  control: Phase 1 glycemic control Mobility: PT OT once extubated Code Status: DNR Family Communication: Updated at bedside Disposition: ICU-if tolerates extubation may transfer to floor tomorrow.   Critical care time: 40 min including chart data review, examination of patient, multidisciplinary rounds, and frequent assessment and modification of ventilatory therapy.   Kipp Brood, MD [facility] ICU Physician [place]  Pager: [phone] Mobile: [phone] After hours: [phone].  02/06/2019, 8:38 AM

## 2019-02-07 DIAGNOSIS — E440 Moderate protein-calorie malnutrition: Secondary | ICD-10-CM

## 2019-02-07 LAB — GLUCOSE, CAPILLARY
Glucose-Capillary: 137 mg/dL — ABNORMAL HIGH (ref 70–99)
Glucose-Capillary: 164 mg/dL — ABNORMAL HIGH (ref 70–99)
Glucose-Capillary: 175 mg/dL — ABNORMAL HIGH (ref 70–99)

## 2019-02-07 LAB — CBC
HCT: 22.9 % — ABNORMAL LOW (ref 36.0–46.0)
HEMOGLOBIN: 6.7 g/dL — AB (ref 12.0–15.0)
MCH: 30 pg (ref 26.0–34.0)
MCHC: 29.3 g/dL — ABNORMAL LOW (ref 30.0–36.0)
MCV: 102.7 fL — ABNORMAL HIGH (ref 80.0–100.0)
Platelets: 152 10*3/uL (ref 150–400)
RBC: 2.23 MIL/uL — ABNORMAL LOW (ref 3.87–5.11)
RDW: 14.6 % (ref 11.5–15.5)
WBC: 10.6 10*3/uL — ABNORMAL HIGH (ref 4.0–10.5)
nRBC: 0.3 % — ABNORMAL HIGH (ref 0.0–0.2)

## 2019-02-07 LAB — MAGNESIUM: Magnesium: 1.5 mg/dL — ABNORMAL LOW (ref 1.7–2.4)

## 2019-02-07 LAB — CULTURE, RESPIRATORY W GRAM STAIN: Culture: NORMAL

## 2019-02-07 LAB — ABO/RH: ABO/RH(D): B POS

## 2019-02-07 LAB — PHOSPHORUS: Phosphorus: 2.7 mg/dL (ref 2.5–4.6)

## 2019-02-07 LAB — CULTURE, RESPIRATORY

## 2019-02-07 LAB — PREPARE RBC (CROSSMATCH)

## 2019-02-07 LAB — HEPARIN LEVEL (UNFRACTIONATED): HEPARIN UNFRACTIONATED: 0.5 [IU]/mL (ref 0.30–0.70)

## 2019-02-07 MED ORDER — SCOPOLAMINE 1 MG/3DAYS TD PT72
1.0000 | MEDICATED_PATCH | TRANSDERMAL | Status: DC
  Administered 2019-02-07: 1.5 mg via TRANSDERMAL
  Filled 2019-02-07: qty 1

## 2019-02-07 MED ORDER — ACETAMINOPHEN 650 MG RE SUPP: 650.0000 mg | Freq: Four times a day (QID) | RECTAL | Status: DC | PRN

## 2019-02-07 MED ORDER — GLYCOPYRROLATE 0.2 MG/ML IJ SOLN
0.2000 mg | INTRAMUSCULAR | Status: DC | PRN
  Administered 2019-02-08: 0.2 mg via INTRAVENOUS
  Filled 2019-02-07: qty 1

## 2019-02-07 MED ORDER — HEPARIN SODIUM (PORCINE) 5000 UNIT/ML IJ SOLN: 5000.0000 [IU] | Freq: Three times a day (TID) | INTRAMUSCULAR | Status: DC

## 2019-02-07 MED ORDER — SODIUM CHLORIDE 0.9% IV SOLUTION
Freq: Once | INTRAVENOUS | Status: AC
  Administered 2019-02-07: 05:00:00 via INTRAVENOUS

## 2019-02-07 MED ORDER — MORPHINE 100MG IN NS 100ML (1MG/ML) PREMIX INFUSION
0.0000 mg/h | INTRAVENOUS | Status: DC
  Filled 2019-02-07 (×2): qty 100

## 2019-02-07 MED ORDER — MORPHINE SULFATE (PF) 2 MG/ML IV SOLN: 2.0000 mg | INTRAVENOUS | Status: DC | PRN

## 2019-02-07 MED ORDER — POLYVINYL ALCOHOL 1.4 % OP SOLN
1.0000 [drp] | Freq: Four times a day (QID) | OPHTHALMIC | Status: DC | PRN
  Filled 2019-02-07: qty 15

## 2019-02-07 MED ORDER — DEXTROSE 5 % IV SOLN: INTRAVENOUS | Status: DC

## 2019-02-07 MED ORDER — GLYCOPYRROLATE 0.2 MG/ML IJ SOLN: 0.2000 mg | INTRAMUSCULAR | Status: DC | PRN

## 2019-02-07 MED ORDER — GLYCOPYRROLATE 1 MG PO TABS
1.0000 mg | ORAL_TABLET | ORAL | Status: DC | PRN
  Filled 2019-02-07: qty 1

## 2019-02-07 MED ORDER — ACETAMINOPHEN 325 MG PO TABS: 650.0000 mg | ORAL_TABLET | Freq: Four times a day (QID) | ORAL | Status: DC | PRN

## 2019-02-07 MED ORDER — MORPHINE BOLUS VIA INFUSION
5.0000 mg | INTRAVENOUS | Status: DC | PRN
  Filled 2019-02-07: qty 5

## 2019-02-07 MED ORDER — DIPHENHYDRAMINE HCL 50 MG/ML IJ SOLN: 25.0000 mg | INTRAMUSCULAR | Status: DC | PRN

## 2019-02-07 MED ORDER — MIDAZOLAM 50MG/50ML (1MG/ML) PREMIX INFUSION
2.0000 mg/h | INTRAVENOUS | Status: DC
  Administered 2019-02-07 – 2019-02-08 (×2): 2 mg/h via INTRAVENOUS
  Filled 2019-02-07: qty 50
  Filled 2019-02-07: qty 100

## 2019-02-07 MED ORDER — MIDAZOLAM BOLUS VIA INFUSION
1.0000 mg | INTRAVENOUS | Status: DC | PRN
  Filled 2019-02-07: qty 1

## 2019-02-07 NOTE — Progress Notes (Signed)
PT Cancellation Note  Patient Details Name: [name] [name] MRN: [id number] DOB: 10-Apr-1933   Cancelled Treatment:    Reason Eval/Treat Not Completed: Medical issues which prohibited therapy(pt with Hgb 6.7 receiving blood and per RN inappropriate with plan for potential comfort care with RN stating need to sign off at this time. Please reorder should pt status change)   Jermika Olden B Tor Tsuda 02/07/2019, 9:09 AM Elwyn Reach, PT Acute Rehabilitation Services Pager: [phone] Office: [phone]

## 2019-02-07 NOTE — Progress Notes (Signed)
NAME:  [name] [name], MRN:  [id number], DOB:  1933/08/20, LOS: 4 ADMISSION DATE:  02/03/2019, CONSULTATION DATE: 02/05/2019 REFERRING MD: Carson Myrtle, CHIEF COMPLAINT: Respiratory failure  HPI/course in hospital  85 year old female history of atrial fibrillation on Xarelto, hypertension who presented on 02/03/2019 with an M2 left-sided stroke status post thrombectomy.  Now with seizures and intubated for airway protection.  Presented 2/14 with aphasia, right facial droop and right hemiparesis.  Underwent thrombectomy of left M2 occlusion. Started on heparin and diltiazem for atrial fibrillation. 2/15 focal right arm twitching with secondary generalization lasting 30 seconds.  Given Ativan and Keppra and intubated for airway protection.  2/16 more awake but poor cough precluded extubation later in the day.  2/17 one-way extubation.  Past Medical History   Past Medical History:  Diagnosis Date  . Abnormal EKG   . HLD (hyperlipidemia)   . HTN (hypertension)      Past Surgical History:  Procedure Laterality Date  . CATARACT EXTRACTION  2006  . HEMORRHOID SURGERY  1971  . hysterectomy - unknown type  1978  . IR CT HEAD LTD  02/03/2019  . IR PERCUTANEOUS ART THROMBECTOMY/INFUSION INTRACRANIAL INC DIAG ANGIO  02/03/2019  . RADIOLOGY WITH ANESTHESIA N/A 02/03/2019   Procedure: IR WITH ANESTHESIA;  Surgeon: Radiologist, Medication, MD;  Location: [facility];  Service: Radiology;  Laterality: N/A;    Interim history/subjective:  Retaining secretions in oro-pharynx. Drop in hemoglobin but no signs of bleeding.    Objective   Blood pressure (!) 165/84, pulse (!) 54, temperature 97.9 F (36.6 C), temperature source Axillary, resp. rate 16, height 5' (1.524 m), weight 66.5 kg, SpO2 98 %.        Intake/Output Summary (Last 24 hours) at 02/07/2019 1032 Last data filed at 02/07/2019 0808 Gross per 24 hour  Intake 2651.88 ml  Output 165 ml  Net 2486.88 ml   Filed Weights   02/07/19 0427  Weight:  66.5 kg    Examination: Physical Exam  Constitutional: No distress. Nasal cannula in place.  Calm   HENT:  ++ upper airway secretion.   Eyes:  Eyes closed  Neck: Neck supple. No JVD present.  Cardiovascular: Normal heart sounds. An irregularly irregular rhythm present. Tachycardia present.  Respiratory: Breath sounds normal. No respiratory distress.  Tolerating pressure support ventilation  GI: Soft. There is no abdominal tenderness.  Genitourinary:    Genitourinary Comments: Foley catheter in place for retention   Musculoskeletal:        General: Edema present.  Neurological: She is unresponsive. GCS eye subscore is 4. GCS verbal subscore is 1. GCS motor subscore is 5.  No facial asymmetry, no spontaneous movement.  Skin: Abrasion, bruising and ecchymosis noted.  Marked skin fragility     Ancillary tests (personally reviewed)  CBC: Recent Labs  Lab 02/03/19 1122 02/04/19 0408 02/05/19 0213 02/05/19 0536 02/06/19 0550 02/07/19 0430  WBC 12.3* 16.7*  --  11.9* 10.5 10.6*  NEUTROABS 10.4* 15.2*  --   --   --   --   HGB 12.2 9.9* 8.8* 9.1* 8.5* 6.7*  HCT 39.5 30.8* 26.0* 29.9* 27.1* 22.9*  MCV 97.8 97.5  --  98.7 100.0 102.7*  PLT 182 179  --  161 153 426    Basic Metabolic Panel: Recent Labs  Lab 02/03/19 1104 02/03/19 1122 02/04/19 0408 02/05/19 0213 02/05/19 0536 02/06/19 0550 02/06/19 1755 02/07/19 0430  NA  --  143 143 141 140 139  --   --  K  --  3.0* 2.9* 4.0 3.8 3.9  --   --   CL  --  103 110  --  114* 113*  --   --   CO2  --  25 20*  --  22 16*  --   --   GLUCOSE  --  108* 133*  --  108* 83  --   --   BUN  --  14 12  --  19 23  --   --   CREATININE 1.00 1.18* 1.28*  --  1.38* 1.24*  --   --   CALCIUM  --  8.9 8.1*  --  7.9* 7.8*  --   --   MG  --   --   --   --  1.7  --  1.6* 1.5*  PHOS  --   --   --   --   --   --  3.1 2.7   GFR: Estimated Creatinine Clearance: 29.3 mL/min (A) (by C-G formula based on SCr of 1.24 mg/dL (H)). Recent Labs    Lab 02/04/19 0408 02/05/19 0536 02/06/19 0550 02/07/19 0430  WBC 16.7* 11.9* 10.5 10.6*    Liver Function Tests: Recent Labs  Lab 02/03/19 1122  AST 20  ALT 10  ALKPHOS 55  BILITOT 1.0  PROT 6.5  ALBUMIN 2.9*   No results for input(s): LIPASE, AMYLASE in the last 168 hours. No results for input(s): AMMONIA in the last 168 hours.  ABG    Component Value Date/Time   PHART 7.425 02/05/2019 0213   PCO2ART 31.6 (L) 02/05/2019 0213   PO2ART 144.0 (H) 02/05/2019 0213   HCO3 20.8 02/05/2019 0213   TCO2 22 02/05/2019 0213   ACIDBASEDEF 3.0 (H) 02/05/2019 0213   O2SAT 99.0 02/05/2019 0213     Coagulation Profile: Recent Labs  Lab 02/03/19 1122  INR 1.08    Cardiac Enzymes: Recent Labs  Lab 02/04/19 0408 02/04/19 1141 02/04/19 2213 02/05/19 0134 02/05/19 0536  TROPONINI 0.17* 0.15* 0.13* 0.14* 0.10*    HbA1C: Hgb A1c MFr Bld  Date/Time Value Ref Range Status  02/04/2019 11:41 AM 5.1 4.8 - 5.6 % Final    Comment:    (NOTE)         Prediabetes: 5.7 - 6.4         Diabetes: >6.4         Glycemic control for adults with diabetes: <7.0     CBG: Recent Labs  Lab 02/06/19 2141 02/07/19 0004 02/07/19 0405 02/07/19 0820  GLUCAP 125* 137* 164* 175*    CXR: Tube in good position.  No airspace disease.  Stable left lung hamartoma previously described (personal interpretation)  Assessment & Plan:  Was critically ill due to respiratory failure following seizure require mechanical ventilation. Status post thrombectomy for left M2 occlusion CVA Seizure secondary to recent stroke Rate controlled Afib. Poor recovery due to profound pharyngeal dysfunction with inability to clear secretions now affecting mental status as well. Relative oliguria. Progressive anemia likely due to acute illness and IV fluids now at transfusion trigger.  PLAN:   No further meaningful intervention to alter course of disease. Have advised family members that comfort care would be  the most appropriate course of action at this time. Have stopped heparin and maintenance fluids to avoid further volume overload or bleeding Start scopolamine for upper airway secretions.  Best practice:  Diet: Nasogastric feeding  Pain/Anxiety/Delirium protocol (if indicated): Discussing comfort care with family VAP protocol (if  indicated): Not applicable DVT prophylaxis: On hold GI prophylaxis: Not applicable Glucose control: Phase 1 glycemic control Mobility: Bedrest.  Foley catheter required for comfort care. Code Status: DNR no intubation, no tracheostomy, no PEG tube Family Communication: Updated at bedside Disposition: ICU-transfer to medical floor when he transitions to comfort care.   35 minutes including greater than 50% of time in counseling and coordination of care.   Kipp Brood, MD [facility] ICU Physician [place]  Pager: [phone] Mobile: [phone] After hours: [phone].  02/07/2019, 10:32 AM

## 2019-02-07 NOTE — Evaluation (Signed)
Clinical/Bedside Swallow Evaluation Patient Details  Name: [name] [name] MRN: [id number] Date of Birth: 05/17/1933  Today's Date: 02/07/2019 Time: SLP Start Time (ACUTE ONLY): 37 SLP Stop Time (ACUTE ONLY): 0930 SLP Time Calculation (min) (ACUTE ONLY): 15 min  Past Medical History:  Past Medical History:  Diagnosis Date  . Abnormal EKG   . HLD (hyperlipidemia)   . HTN (hypertension)    Past Surgical History:  Past Surgical History:  Procedure Laterality Date  . CATARACT EXTRACTION  2006  . HEMORRHOID SURGERY  1971  . hysterectomy - unknown type  1978  . IR CT HEAD LTD  02/03/2019  . IR PERCUTANEOUS ART THROMBECTOMY/INFUSION INTRACRANIAL INC DIAG ANGIO  02/03/2019  . RADIOLOGY WITH ANESTHESIA N/A 02/03/2019   Procedure: IR WITH ANESTHESIA;  Surgeon: Radiologist, Medication, MD;  Location: [name];  Service: Radiology;  Laterality: N/A;   HPI:  Pt is an 85 y.o. female with past medical history of hypertension, hyperlipidemia, atrial fibrillation who presented to [name] as a code stroke following her having "trouble getting her words out". She was transfered to [name] for emergent mechanical thrombectomy. MRI of 02/04/19 revealed: Moderate-sized acute nonhemorrhagic ischemic anterior left MCA territory infarct. Few additional small volume cortical infarcts involving the mesial left temporal lobe and left occipital pole, likely related to fetal type left PCA seen on prior CTA. Additional 6 mm acute ischemic nonhemorrhagic right cerebellar infarct. She is status post thrombectomy for left M2 occlusion CVA. Pt was intuibated on 02/04/19 following seizure and possible aspiration with extubation on 02/06/19.    Assessment / Plan / Recommendation Clinical Impression  Pt was seen for bedside swallow evaluation with her husband present. She was adequately alert throughout the evaluation but exhibited difficulty following the necessary directions for an oral mechanism exam. A wet vocal quality  was noted at baseline, indicating poor secretion management. Oral care was provided and trials of puree solids and ice chips were administered but bolus manipulation was absent despite verbal and tactile cues, suggesting reduced awareness of the boluses. It is recommended that the pt's NPO status be maintained at this time and SLP will follow to assess improvement in swallow function as well as to evaluate language function.  SLP Visit Diagnosis: Dysphagia, oropharyngeal phase (R13.12)    Aspiration Risk  Severe aspiration risk    Diet Recommendation NPO;Alternative means - temporary   Medication Administration: Via alternative means    Other  Recommendations Oral Care Recommendations: Oral care QID   Follow up Recommendations Other (comment)(Pt will need continued SLP services at discharge)      Frequency and Duration min 2x/week  2 weeks       Prognosis Prognosis for Safe Diet Advancement: Fair Barriers to Reach Goals: Language deficits;Severity of deficits      Swallow Study   General Date of Onset: 02/06/19 HPI: Pt is an 85 y.o. female with past medical history of hypertension, hyperlipidemia, atrial fibrillation who presented to [name] as a code stroke following her having "trouble getting her words out". She was transfered to [name] for emergent mechanical thrombectomy. MRI of 02/04/19 revealed: Moderate-sized acute nonhemorrhagic ischemic anterior left MCA territory infarct. Few additional small volume cortical infarcts involving the mesial left temporal lobe and left occipital pole, likely related to fetal type left PCA seen on prior CTA. Additional 6 mm acute ischemic nonhemorrhagic right cerebellar infarct. She is status post thrombectomy for left M2 occlusion CVA. Pt was intuibated on 02/04/19 following seizure and possible aspiration with  extubation on 02/06/19.  Type of Study: Bedside Swallow Evaluation Previous Swallow Assessment: none Diet Prior to this Study:  NPO;NG Tube Respiratory Status: Nasal cannula History of Recent Intubation: Yes Length of Intubations (days): 2 days Date extubated: 02/06/19 Behavior/Cognition: Alert;Cooperative;Doesn't follow directions Oral Cavity Assessment: Dry;Dried secretions Oral Care Completed by SLP: Yes Oral Cavity - Dentition: Adequate natural dentition Patient Positioning: Upright in bed;Postural control adequate for testing Baseline Vocal Quality: Not observed Volitional Swallow: Unable to elicit    Oral/Motor/Sensory Function Overall Oral Motor/Sensory Function: Other (comment)(Pt unable to follow necessary commands for assessment)   Ice Chips Ice chips: Impaired Presentation: Spoon Oral Phase Impairments: Poor awareness of bolus   Thin Liquid Thin Liquid: Not tested    Nectar Thick Nectar Thick Liquid: Not tested   Honey Thick Honey Thick Liquid: Not tested   Puree Puree: Impaired Presentation: Spoon Oral Phase Impairments: Poor awareness of bolus   Solid     Solid: Not tested     [name] [name], [name], [name] Office number [phone] Pager [phone]  [name] [name] 02/07/2019,10:17 AM

## 2019-02-07 NOTE — Progress Notes (Signed)
eLink Physician-Brief Progress Note Patient Name: Carylon Tamburro DOB: 02-03-1933 MRN: [id number]   Date of Service  02/07/2019  HPI/Events of Note  Anemia - Hgb = 6.7  eICU Interventions  Will transfuse 1 unit PRBC now.      Intervention Category Major Interventions: Other:  Delitha Elms Cornelia Copa 02/07/2019, 5:01 AM

## 2019-02-07 NOTE — Progress Notes (Signed)
CRITICAL VALUE ALERT  Critical Value:  Hbg 6.7   Date & Time Notied:  02/07/19 at 0500  Provider Notified:  Oletta Darter  Orders Received/Actions taken: 1U PRBC ordered

## 2019-02-07 NOTE — Progress Notes (Signed)
Nutrition Brief Note  Per MD notes and after speaking with family noted plan to  Transition to comfort care.  No further nutrition interventions warranted at this time.  Please re-consult as needed.   [place], [place], [place] Pager [phone] After Hours Pager

## 2019-02-07 NOTE — Progress Notes (Signed)
STROKE TEAM PROGRESS NOTE     SUBJECTIVE (INTERVAL HISTORY)  Patient  Is awake and alert. She is now extubated. She follows simple midline commands only. She is able to move left side purposefully.Marland Kitchen Her husband is  at the bedside.she is having upper airway secretions requiring frequent suctioning  OBJECTIVE Vitals:   02/07/19 0900 02/07/19 1000 02/07/19 1100 02/07/19 1200  BP: (!) 162/80 (!) 198/101 (!) 178/90 (!) 183/93  Pulse: (!) 59 72 64 65  Resp: 16 (!) 22 20 20   Temp:      TempSrc:      SpO2: 97% 96% 97% 98%  Weight:      Height:        CBC:  Recent Labs  Lab 02/03/19 1122 02/04/19 0408  02/06/19 0550 02/07/19 0430  WBC 12.3* 16.7*   < > 10.5 10.6*  NEUTROABS 10.4* 15.2*  --   --   --   HGB 12.2 9.9*   < > 8.5* 6.7*  HCT 39.5 30.8*   < > 27.1* 22.9*  MCV 97.8 97.5   < > 100.0 102.7*  PLT 182 179   < > 153 152   < > = values in this interval not displayed.    Basic Metabolic Panel:  Recent Labs  Lab 02/05/19 0536 02/06/19 0550 02/06/19 1755 02/07/19 0430  NA 140 139  --   --   K 3.8 3.9  --   --   CL 114* 113*  --   --   CO2 22 16*  --   --   GLUCOSE 108* 83  --   --   BUN 19 23  --   --   CREATININE 1.38* 1.24*  --   --   CALCIUM 7.9* 7.8*  --   --   MG 1.7  --  1.6* 1.5*  PHOS  --   --  3.1 2.7    Lipid Panel:     Component Value Date/Time   CHOL 108 02/04/2019 1141   TRIG 77 02/04/2019 1141   HDL 30 (L) 02/04/2019 1141   CHOLHDL 3.6 02/04/2019 1141   VLDL 15 02/04/2019 1141   LDLCALC 63 02/04/2019 1141   HgbA1c:  Lab Results  Component Value Date   HGBA1C 5.1 02/04/2019   Urine Drug Screen:     Component Value Date/Time   LABOPIA NONE DETECTED 02/04/2019 0012   COCAINSCRNUR NONE DETECTED 02/04/2019 0012   LABBENZ NONE DETECTED 02/04/2019 0012   AMPHETMU NONE DETECTED 02/04/2019 0012   THCU NONE DETECTED 02/04/2019 0012   LABBARB NONE DETECTED 02/04/2019 0012    Alcohol Level     Component Value Date/Time   ETH <10 02/03/2019  1122    IMAGING  Dg Chest 1 View 02/03/2019  IMPRESSION:  No active disease.    Mr Brain Wo Contrast 02/04/2019 IMPRESSION:  1. Moderate-sized acute nonhemorrhagic ischemic anterior left MCA territory infarct.  2. Few additional small volume cortical infarcts involving the mesial left temporal lobe and left occipital pole, likely related to fetal type left PCA seen on prior CTA.  3. Additional 6 mm acute ischemic nonhemorrhagic right cerebellar infarct.  4. Focus of susceptibility artifact at the base of the left sylvian fissure, suspected to reflect residual and/or recurrent thrombus within a proximal left M2 branch.  5. Underlying atrophy with advanced chronic microvascular ischemic disease.    Cerebral Angiogram and Intervention S/P Lt common carotid arterriogram followed by complete revascularization of occluded dominant inf division with x  2 passes with 35mm x 20mm embotrap retriever dice achieving a TICI 2 b revascularization   Transthoracic Echocardiogram  Ejection fraction is greater than 65%. Severe asymmetric left ventricular hypertrophy.    PHYSICAL EXAM Blood pressure (!) 183/93, pulse 65, temperature 97.9 F (36.6 C), temperature source Axillary, resp. rate 20, height 5' (1.524 m), weight 66.5 kg, SpO2 98 %. Elderly Caucasian lady who is   not in distress. . Afebrile. Head is nontraumatic. Neck is supple without bruit.    Cardiac exam no murmur or gallop. Lungs are clear to auscultation. Distal pulses are well felt.  Neurological Exam :  She is   awake interactive  But remains. globally aphasic and does not follow commands consistently  She has left gaze preference but will follow gaze to the right past midline. She blinks to threat on the left but less on the right. Right lower facial weakness. Tongue midline. Motor system exam able to move left upper and lower extremities spontaneously and purposefully. Withdraws from the right lower extremity greater than upper  extremity to painful stimuli. Deep tendon reflexes symmetric. Sensation appears intact bilaterally. Plantars downgoing. Gait not tested.     ASSESSMENT/PLAN [name] is a 85 y.o. female with history of hypertension, hyperlipidemia, atrial fibrillation on Xarelto presenting with aphasia, right facial droop and right hemiparesis. She did not receive IV t-PA due to anticoagulation. M2 occlusion with mechanical thrombectomy in IR.  Stroke:  Multiple infarcts including anterior left MCA territory - embolic - history of remote afib.  Resultant  aphasia  CT head - OSH  MRI head - Moderate-sized acute nonhemorrhagic ischemic anterior left MCA territory infarct. Few additional small volume cortical infarcts involving the mesial left temporal lobe and left occipital pole, likely related to fetal type left PCA seen on prior CTA. Additional 6 mm acute ischemic nonhemorrhagic right cerebellar infarct.   MRA head - not performed  CTA H&N - OSH - left M2 occlusion  Carotid Doppler - cerebral angiogram  2D Echo - ejection fraction greater than 65%. No wall motion abnormalities.  LDL - 63 mg percent  HgbA1c - 5.1  UDS - negative  VTE prophylaxis - SCDs and IV heparin  Diet - NPO  Xarelto (rivaroxaban) daily prior to admission, now on heparin IV  Patient counseled to be compliant with her antithrombotic medications  Ongoing aggressive stroke risk factor management  Therapy recommendations:  pending  Disposition:  Pending  Hypertension  Stable . Permissive hypertension (OK if < 220/120) but gradually normalize in 5-7 days . Long-term BP goal normotensive  Hyperlipidemia  Lipid lowering medication PTA: Zocor 20 mg daily  LDL pending, goal < 70  Current lipid lowering medication: none (await LDL)  Continue statin at discharge   Other Stroke Risk Factors  Advanced age  Former cigarette smoker - quit  Atrial fibrillation    Other Active Problems  Hypokalemia -  supplement  Hypomagnesemia - recheck in AM  Atrial fibrillation - IV heparin - Cardiology consult for rate control  Anemia - monitor - Hb 9.9  Leukocytosis - 16.7 (temp 99) (U/A and CXR not C/W infection) monitor  Creatinine - 1.28  Elevated troponin enzymes - trending down - may be due to RVR - continue to monitor - may need cardiology to address (on IV heparin)  Symptomatic seizures on 02/04/19-Keppra started   Hospital day # 4  She has shown drop in hematocrit requiring blood transfusion. She is on IV heparin for her A. Fib and may  need to stop this. She is also having trouble clearing her airway and may not be able to protect her airway for long period. Patient's prognosis is guarded as she may need prolonged ventilatory support given the significant aphasia she has likely difficult to protect her airways. I had a long discussion with her husband   at the bedside and answered questions.he understands her poor prognosis and agrees to DO NOT RESUSCITATE and would like  no plans for reintubation if she gets worse .Discussed with Dr. Doyne Keel of critical care medicine who is in agreement hopefully try to extubate her today.Marland KitchenMarland KitchenThis patient is critically ill and at significant risk of neurological worsening, death and care requires constant monitoring of vital signs, hemodynamics,respiratory and cardiac monitoring, extensive review of multiple databases, frequent neurological assessment, discussion with family, other specialists and medical decision making of high complexity.I have made any additions or clarifications directly to the above note.This critical care time does not reflect procedure time, or teaching time or supervisory time of PA/NP/Med Resident etc but could involve care discussion time.  I spent 30 minutes of neurocritical care time  in the care of  this patient.      Antony Contras, MD Medical Director [place] Pager: [phone] 02/07/2019 2:20 PM   To  contact Stroke Continuity provider, please refer to [email or web address] After hours, contact General Neurology

## 2019-02-07 NOTE — Progress Notes (Signed)
OT Cancellation Note  Patient Details Name: [name] MRN: [id number] DOB: [birth year]   Cancelled Treatment:    Reason Eval/Treat Not Completed: Medical issues which prohibited therapy (pt receiving blood with Hgb 6.7) and per PT report from RN inappropriate for mobilization with plan for potential comfort care.  OT signing off at this time, if further needs arise please re-consult.   Delight Stare, OT Acute Rehabilitation Services Pager [phone] Office [phone]    Delight Stare 02/07/2019, 9:27 AM

## 2019-02-08 ENCOUNTER — Other Ambulatory Visit: Payer: Self-pay

## 2019-02-08 DIAGNOSIS — G8191 Hemiplegia, unspecified affecting right dominant side: Secondary | ICD-10-CM | POA: Diagnosis present

## 2019-02-08 DIAGNOSIS — R402142 Coma scale, eyes open, spontaneous, at arrival to emergency department: Secondary | ICD-10-CM | POA: Diagnosis present

## 2019-02-08 DIAGNOSIS — I6602 Occlusion and stenosis of left middle cerebral artery: Secondary | ICD-10-CM | POA: Diagnosis not present

## 2019-02-08 DIAGNOSIS — I63412 Cerebral infarction due to embolism of left middle cerebral artery: Secondary | ICD-10-CM | POA: Diagnosis present

## 2019-02-08 DIAGNOSIS — Z9911 Dependence on respirator [ventilator] status: Secondary | ICD-10-CM | POA: Diagnosis not present

## 2019-02-08 DIAGNOSIS — R29710 NIHSS score 10: Secondary | ICD-10-CM | POA: Diagnosis present

## 2019-02-08 DIAGNOSIS — Z7901 Long term (current) use of anticoagulants: Secondary | ICD-10-CM | POA: Diagnosis not present

## 2019-02-08 DIAGNOSIS — J9600 Acute respiratory failure, unspecified whether with hypoxia or hypercapnia: Secondary | ICD-10-CM | POA: Diagnosis not present

## 2019-02-08 DIAGNOSIS — Z515 Encounter for palliative care: Secondary | ICD-10-CM | POA: Diagnosis not present

## 2019-02-08 DIAGNOSIS — I480 Paroxysmal atrial fibrillation: Secondary | ICD-10-CM | POA: Diagnosis present

## 2019-02-08 DIAGNOSIS — Z66 Do not resuscitate: Secondary | ICD-10-CM | POA: Diagnosis present

## 2019-02-08 DIAGNOSIS — I639 Cerebral infarction, unspecified: Secondary | ICD-10-CM | POA: Diagnosis present

## 2019-02-08 DIAGNOSIS — R402222 Coma scale, best verbal response, incomprehensible words, at arrival to emergency department: Secondary | ICD-10-CM | POA: Diagnosis present

## 2019-02-08 DIAGNOSIS — E785 Hyperlipidemia, unspecified: Secondary | ICD-10-CM | POA: Diagnosis present

## 2019-02-08 DIAGNOSIS — I10 Essential (primary) hypertension: Secondary | ICD-10-CM | POA: Diagnosis present

## 2019-02-08 DIAGNOSIS — R4701 Aphasia: Secondary | ICD-10-CM | POA: Diagnosis present

## 2019-02-08 DIAGNOSIS — R29810 Facial weakness: Secondary | ICD-10-CM | POA: Diagnosis present

## 2019-02-08 DIAGNOSIS — R402342 Coma scale, best motor response, flexion withdrawal, at arrival to emergency department: Secondary | ICD-10-CM | POA: Diagnosis present

## 2019-02-08 DIAGNOSIS — D649 Anemia, unspecified: Secondary | ICD-10-CM | POA: Diagnosis present

## 2019-02-08 DIAGNOSIS — E440 Moderate protein-calorie malnutrition: Secondary | ICD-10-CM | POA: Diagnosis present

## 2019-02-08 LAB — TYPE AND SCREEN
ABO/RH(D): B POS
Antibody Screen: NEGATIVE
Unit division: 0

## 2019-02-08 LAB — BPAM RBC
BLOOD PRODUCT EXPIRATION DATE: 202003012359
ISSUE DATE / TIME: 202002180737
Unit Type and Rh: 7300

## 2019-02-08 MED ORDER — LORAZEPAM 2 MG/ML IJ SOLN
2.0000 mg | Freq: Once | INTRAMUSCULAR | Status: DC | PRN
  Filled 2019-02-08: qty 1

## 2019-02-08 MED ORDER — HEPARIN SOD (PORK) LOCK FLUSH 100 UNIT/ML IV SOLN
250.0000 [IU] | INTRAVENOUS | Status: AC | PRN
  Administered 2019-02-08: 20:00:00

## 2019-02-08 MED ORDER — SCOPOLAMINE 1 MG/3DAYS TD PT72: 1.0000 | MEDICATED_PATCH | TRANSDERMAL | 12 refills | Status: AC

## 2019-02-08 MED ORDER — MIDAZOLAM HCL 2 MG/2ML IJ SOLN: 2.0000 mg | Freq: Once | INTRAMUSCULAR | Status: DC

## 2019-02-08 MED ORDER — MIDAZOLAM 50MG/50ML (1MG/ML) PREMIX INFUSION: 2.0000 mg/h | INTRAVENOUS | Status: AC

## 2019-02-08 MED ORDER — ACETAMINOPHEN 650 MG RE SUPP: 650.0000 mg | Freq: Four times a day (QID) | RECTAL | 0 refills | Status: AC | PRN

## 2019-02-08 MED ORDER — FENTANYL CITRATE (PF) 100 MCG/2ML IJ SOLN: 25.0000 ug | INTRAMUSCULAR | Status: DC | PRN

## 2019-02-08 MED ORDER — POLYVINYL ALCOHOL 1.4 % OP SOLN: 1.0000 [drp] | Freq: Four times a day (QID) | OPHTHALMIC | 0 refills | Status: AC | PRN

## 2019-02-08 MED ORDER — HEPARIN SOD (PORK) LOCK FLUSH 100 UNIT/ML IV SOLN
250.0000 [IU] | INTRAVENOUS | Status: AC | PRN
  Administered 2019-02-08: 500 [IU]

## 2019-02-08 MED ORDER — GLYCOPYRROLATE 0.2 MG/ML IJ SOLN
0.4000 mg | Freq: Four times a day (QID) | INTRAMUSCULAR | Status: DC
  Administered 2019-02-08: 0.4 mg via INTRAVENOUS
  Filled 2019-02-08: qty 2

## 2019-02-08 MED ORDER — GLYCOPYRROLATE 0.2 MG/ML IJ SOLN: 0.4000 mg | Freq: Four times a day (QID) | INTRAMUSCULAR | Status: AC

## 2019-02-08 NOTE — Social Work (Signed)
Clinical Social Worker facilitated patient discharge including contacting patient family and facility to confirm patient discharge plans.  Clinical information faxed to facility and family agreeable with plan.  CSW arranged ambulance transport via [facility] to [place]  RN to call [phone]  with report prior to discharge.  Clinical Social Worker will sign off for now as social work intervention is no longer needed. Please consult [place] again if new need arises.  Westley Hummer, MSW, [place] Social Worker [phone]

## 2019-02-08 NOTE — Progress Notes (Signed)
Palliative:  I have reviewed records and discussed with hospice liaison plans for transition to hospice facility in [facility]. They will be able to continue Versed infusion for seizure suppression at hospice facility. I have spoken with pharmacy. I am recommending Ativan IV 2 mg bolus ~30 minutes prior to transfer and stopping Versed infusion and then restarting Versed infusion once she arrives to hospice facility.   I have not engaged with patient or family. No charge note.     Vinie Sill, NP Palliative Medicine Team Pager # [phone] (M-F 8a-5p) Team Phone # [phone] (Nights/Weekends)

## 2019-02-08 NOTE — Clinical Social Work Note (Signed)
Clinical Social Work Assessment  Patient Details  Name: [name] [name] MRN: [id number] Date of Birth: [birth year]  Date of referral:  02/08/19               Reason for consult:  End of Life/Hospice, Discharge Planning                Permission sought to share information with:  Facility Sport and exercise psychologist, Family Supports Permission granted to share information::  No(pt at end of life)  Name::     [place]::  [place]  Relationship::  daughter  Contact Information:  [phone]  Housing/Transportation Living arrangements for the past 2 months:  Single Family Home Source of Information:  Adult Children Patient Interpreter Needed:  None Criminal Activity/Legal Involvement Pertinent to Current Situation/Hospitalization:  No - Comment as needed Significant Relationships:  Adult Children, Community Support, Friend, Spouse Lives with:  Spouse Do you feel safe going back to the place where you live?  No Need for family participation in patient care:  Yes (Comment)  Care giving concerns: CSW met with pt family at bedside, they would like pt to be comfortable and closer to her spouse in [place].   Social Worker assessment / plan:  CSW met with pt family. Pt son and daughter as well as a family friend at bedside. Pt daughter had just spoken with [facility]. They are in agreement with transfer if there is a bed available, they would like recommendations for pt to be comfortable in transport.   CSW explained that palliative care will likely come and make recommendations. Await palliative recommendations and arrangements for hospice home to meet with family.  Employment status:  Retired Forensic scientist:  Medicare PT Recommendations:  Not assessed at this time [place] / Referral to community resources:  Other (Comment Required)(Hospice)  Patient/Family's Response to care:  Pt family amenable to speaking with CSW, looking forward to pt being  closer to her spouse.  Patient/Family's Understanding of and Emotional Response to Diagnosis, Current Treatment, and Prognosis:  Pt family state understanding of diagnosis, current treatment and prognosis. Pt family amenable to transfer to hospice and look forward for pt and pt spouse to be able to spend some time together as pt nears end of life. Pt family emotionally appropriate for situation.  Emotional Assessment Appearance:  Appears stated age Attitude/Demeanor/Rapport:  Unable to Assess Affect (typically observed):  Unable to Assess Orientation:  Fluctuating Orientation (Suspected and/or reported Sundowners) Alcohol / Substance use:  Not Applicable Psych involvement (Current and /or in the community):  No (Comment)  Discharge Needs  Concerns to be addressed:  Care Coordination Readmission within the last 30 days:  No Current discharge risk:  Other(End of Life) Barriers to Discharge:  Continued Medical Work up   [employer], [place] 02/08/2019, 12:53 PM

## 2019-02-08 NOTE — Progress Notes (Signed)
[facility]  Met with family. Confirmed intrest in [place] at [place]. Discussed Hospice philosophy and services. They are in agreement. Pt is a DNR. Versed gtt ordered will arrive at [facility] in [place] by 700pm Ambulance set up by [facility] for 700pm pickup. Nurse aware to give ativan dose prior to ambulance ride and we will hook pt back to Versed gtt once arrival to [facility]. Jearl Klinefelter [phone]

## 2019-02-08 NOTE — Discharge Summary (Addendum)
Stroke Discharge Summary  Patient ID: [name] [name]   MRN: [id number]      DOB: Jan 12, 1933  Date of Admission: 02/03/2019 Date of Discharge: 02/08/2019  Attending Physician:  Garvin Fila, MD, Stroke MD Consultant(s):    pulmonary/intensive care , Olene Craven) Estanislado Pandy, MD (Interventional Neuroradiologist)  Patient's PCP:  Maggie Schwalbe, PA-C  DISCHARGE DIAGNOSIS:  Principal Problem:   Middle cerebral artery embolism, left, embolic secondary to A. fib on anticoagulation status post mechanical thrombectomy Active Problems:   Malnutrition of moderate degree Patient made DO NOT RESUSCITATE and comfort care upon family request  Past Medical History:  Diagnosis Date  . Abnormal EKG   . HLD (hyperlipidemia)   . HTN (hypertension)    Past Surgical History:  Procedure Laterality Date  . CATARACT EXTRACTION  2006  . HEMORRHOID SURGERY  1971  . hysterectomy - unknown type  1978  . IR CT HEAD LTD  02/03/2019  . IR PERCUTANEOUS ART THROMBECTOMY/INFUSION INTRACRANIAL INC DIAG ANGIO  02/03/2019  . RADIOLOGY WITH ANESTHESIA N/A 02/03/2019   Procedure: IR WITH ANESTHESIA;  Surgeon: Radiologist, Medication, MD;  Location: [place];  Service: Radiology;  Laterality: N/A;    Allergies as of 02/08/2019      Reactions   Amoxicillin-pot Clavulanate Other (See Comments)   Unknown reaction more than 50 years ago. Did it involve swelling of the face/tongue/throat, SOB, or low BP? unknown Did it involve sudden or severe rash/hives, skin peeling, or any reaction on the inside of your mouth or nose? Unknown Did you need to seek medical attention at a hospital or doctor's office? Unknown When did it last happen?prior to 85 yrs old If all above answers are "NO", may proceed with cephalosporin use.   Codeine Other (See Comments)   Mild hallucinations   Eliquis [apixaban] Hives   Metoclopramide Hcl Other (See Comments)   Unknown reaction   Morphine Other (See Comments)   hallucinations    Nadolol Other (See Comments)   Runny nose, cough   Sulfamethoxazole-trimethoprim Diarrhea   Valacyclovir Diarrhea      Medication List    STOP taking these medications   acetaminophen 500 MG tablet Commonly known as:  TYLENOL Replaced by:  acetaminophen 650 MG suppository   albuterol (2.5 MG/3ML) 0.083% nebulizer solution Commonly known as:  PROVENTIL   albuterol 108 (90 Base) MCG/ACT inhaler Commonly known as:  PROVENTIL HFA;VENTOLIN HFA   amiodarone 200 MG tablet Commonly known as:  PACERONE   diazepam 2 MG tablet Commonly known as:  VALIUM   Fluticasone-Salmeterol 250-50 MCG/DOSE Aepb Commonly known as:  ADVAIR   levofloxacin 500 MG tablet Commonly known as:  LEVAQUIN   levothyroxine 75 MCG tablet Commonly known as:  SYNTHROID, LEVOTHROID   montelukast 10 MG tablet Commonly known as:  SINGULAIR   omeprazole 20 MG capsule Commonly known as:  PRILOSEC   Rivaroxaban 15 MG Tabs tablet Commonly known as:  XARELTO   simvastatin 20 MG tablet Commonly known as:  ZOCOR     TAKE these medications   acetaminophen 650 MG suppository Commonly known as:  TYLENOL Place 1 suppository (650 mg total) rectally every 6 (six) hours as needed (Fever >/= 101). Replaces:  acetaminophen 500 MG tablet   glycopyrrolate 0.2 MG/ML injection Commonly known as:  ROBINUL Inject 2 mLs (0.4 mg total) into the vein 4 (four) times daily.   MIDAZOLAM 50MG  IN NS 50ML (1MG /ML) PREMIX INFUSION Inject 2-10 mg/hr into the vein continuous.  polyvinyl alcohol 1.4 % ophthalmic solution Commonly known as:  LIQUIFILM TEARS Place 1 drop into both eyes 4 (four) times daily as needed for dry eyes.   scopolamine 1 MG/3DAYS Commonly known as:  TRANSDERM-SCOP Place 1 patch (1.5 mg total) onto the skin every 3 (three) days. Start taking on:  February 10, 2019       LABORATORY STUDIES CBC    Component Value Date/Time   WBC 10.6 (H) 02/07/2019 0430   RBC 2.23 (L) 02/07/2019 0430   HGB 6.7  (LL) 02/07/2019 0430   HCT 22.9 (L) 02/07/2019 0430   PLT 152 02/07/2019 0430   MCV 102.7 (H) 02/07/2019 0430   MCH 30.0 02/07/2019 0430   MCHC 29.3 (L) 02/07/2019 0430   RDW 14.6 02/07/2019 0430   LYMPHSABS 0.5 (L) 02/04/2019 0408   MONOABS 0.9 02/04/2019 0408   EOSABS 0.0 02/04/2019 0408   BASOSABS 0.0 02/04/2019 0408   CMP    Component Value Date/Time   NA 139 02/06/2019 0550   NA 143 01/31/2019 0951   K 3.9 02/06/2019 0550   CL 113 (H) 02/06/2019 0550   CO2 16 (L) 02/06/2019 0550   GLUCOSE 83 02/06/2019 0550   BUN 23 02/06/2019 0550   BUN 21 01/31/2019 0951   CREATININE 1.24 (H) 02/06/2019 0550   CALCIUM 7.8 (L) 02/06/2019 0550   PROT 6.5 02/03/2019 1122   PROT 5.8 (L) 01/31/2019 0951   ALBUMIN 2.9 (L) 02/03/2019 1122   ALBUMIN 3.5 (L) 01/31/2019 0951   AST 20 02/03/2019 1122   ALT 10 02/03/2019 1122   ALKPHOS 55 02/03/2019 1122   BILITOT 1.0 02/03/2019 1122   BILITOT 0.6 01/31/2019 0951   GFRNONAA 40 (L) 02/06/2019 0550   GFRAA 47 (L) 02/06/2019 0550   COAGS Lab Results  Component Value Date   INR 1.08 02/03/2019   Lipid Panel    Component Value Date/Time   CHOL 108 02/04/2019 1141   TRIG 77 02/04/2019 1141   HDL 30 (L) 02/04/2019 1141   CHOLHDL 3.6 02/04/2019 1141   VLDL 15 02/04/2019 1141   LDLCALC 63 02/04/2019 1141   HgbA1C  Lab Results  Component Value Date   HGBA1C 5.1 02/04/2019   Urinalysis    Component Value Date/Time   COLORURINE YELLOW 02/04/2019 0012   APPEARANCEUR CLEAR 02/04/2019 0012   LABSPEC 1.043 (H) 02/04/2019 0012   PHURINE 7.0 02/04/2019 0012   GLUCOSEU NEGATIVE 02/04/2019 0012   HGBUR MODERATE (A) 02/04/2019 0012   BILIRUBINUR NEGATIVE 02/04/2019 0012   KETONESUR NEGATIVE 02/04/2019 0012   PROTEINUR 100 (A) 02/04/2019 0012   NITRITE NEGATIVE 02/04/2019 0012   LEUKOCYTESUR NEGATIVE 02/04/2019 0012   Urine Drug Screen     Component Value Date/Time   LABOPIA NONE DETECTED 02/04/2019 0012   COCAINSCRNUR NONE DETECTED  02/04/2019 0012   LABBENZ NONE DETECTED 02/04/2019 0012   AMPHETMU NONE DETECTED 02/04/2019 0012   THCU NONE DETECTED 02/04/2019 0012   LABBARB NONE DETECTED 02/04/2019 0012    Alcohol Level    Component Value Date/Time   ETH <10 02/03/2019 1122     SIGNIFICANT DIAGNOSTIC STUDIES Dg Chest 1 View  Result Date: 02/03/2019 CLINICAL DATA:  Stroke. EXAM: CHEST  1 VIEW COMPARISON:  December 15, 2018 FINDINGS: Stable cardiomegaly. The hila and mediastinum are unremarkable. No pneumothorax. No focal infiltrate in the lungs. IMPRESSION: No active disease. Electronically Signed   By: Dorise Bullion III M.D   On: 02/03/2019 19:35   Ct Head Wo Contrast  Result Date: 02/04/2019 CLINICAL DATA:  Stroke follow-up, less responsive, seizures. EXAM: CT HEAD WITHOUT CONTRAST TECHNIQUE: Contiguous axial images were obtained from the base of the skull through the vertex without intravenous contrast. COMPARISON:  Head CT dated 02/03/2019 and brain MRI dated 02/04/2019 FINDINGS: Brain: Ventricles are stable in size and configuration. Chronic small vessel ischemic changes again noted within the bilateral periventricular and subcortical white matter regions. Subtle areas of low density are again seen within the region of the LEFT insular ribbon and LEFT frontal operculum, not appreciably changed in extent compared to the earlier head CT, corresponding to the acute infarct demonstrated on subsequent brain MRI. No parenchymal or extra-axial hemorrhage identified. No midline shift or herniation. Vascular: Chronic calcified atherosclerotic changes of the large vessels at the skull base. No unexpected hyperdense vessel. Skull: Normal. Negative for fracture or focal lesion. Sinuses/Orbits: No acute finding. Other: None. IMPRESSION: 1. Stable head CT. The subtle edema at the level of the LEFT insular ribbon and LEFT frontal operculum is unchanged in extent, corresponding to the acute MCA infarct described on recent MRI. 2. No  parenchymal or extra-axial hemorrhage seen. No significant mass effect, midline shift or herniation. Electronically Signed   By: Franki Cabot M.D.   On: 02/04/2019 23:14   Mr Brain Wo Contrast  Result Date: 02/04/2019 CLINICAL DATA:  Follow-up examination for acute stroke. EXAM: MRI HEAD WITHOUT CONTRAST TECHNIQUE: Multiplanar, multiecho pulse sequences of the brain and surrounding structures were obtained without intravenous contrast. COMPARISON:  Prior CT and CTA from 02/03/2019 FINDINGS: Brain: Advanced age-related cerebral atrophy with chronic microvascular ischemic disease. Patchy and confluent restricted diffusion involving the left insula as well as the overlying left frontal operculum and left frontal cortical in underlying subcortical and deep white matter seen, compatible with moderate size acute left MCA territory infarct. No associated hemorrhage or significant mass effect. Focus of susceptibility artifact at the base of the left sylvian fissure suspected to reflect possible residual and/or recurrent thrombus within a left M2 branch. Few additional scattered subcentimeter cortical infarcts seen involving the left occipital pole as well as the posterior left hippocampal formation, likely related to the fetal type left PCA seen on prior CTA. Additional punctate 6 mm right cerebellar nonhemorrhagic infarct noted (series 5, image 56). No mass lesion, midline shift, or significant mass effect. No hydrocephalus. No extra-axial fluid collection. Pituitary gland within normal limits. Midline structures intact. Vascular: Major intracranial vascular flow voids maintained. Left vertebral artery hypoplastic and not well seen. Skull and upper cervical spine: Degenerative thickening at the tectorial membrane with mild narrowing at the craniocervical junction. Upper cervical spine normal. Bone marrow signal intensity within normal limits. No scalp soft tissue abnormality. Sinuses/Orbits: Patient status post  bilateral ocular lens replacement. Paranasal sinuses are clear. No significant mastoid effusion. Inner ear structures normal. Other: None. IMPRESSION: 1. Moderate-sized acute nonhemorrhagic ischemic anterior left MCA territory infarct. 2. Few additional small volume cortical infarcts involving the mesial left temporal lobe and left occipital pole, likely related to fetal type left PCA seen on prior CTA. 3. Additional 6 mm acute ischemic nonhemorrhagic right cerebellar infarct. 4. Focus of susceptibility artifact at the base of the left sylvian fissure, suspected to reflect residual and/or recurrent thrombus within a proximal left M2 branch. 5. Underlying atrophy with advanced chronic microvascular ischemic disease. Electronically Signed   By: Jeannine Boga M.D.   On: 02/04/2019 05:25   [place]  Result Date: 02/06/2019 INDICATION: New onset of aphasia and right-sided  weakness.  Right facial droop. CT angiogram of the head and neck revealing occlusion of a dominant superior division of the left MCA. EXAM: 1. EMERGENT LARGE VESSEL OCCLUSION THROMBOLYSIS anterior CIRCULATION) COMPARISON:  CT angiogram of the head and neck of 02/03/2017. MEDICATIONS: Vancomycin 1 g IV antibiotic was administered within 1 hour of the procedure. ANESTHESIA/SEDATION: General anesthesia. CONTRAST:  Isovue 300 approximately 75 mL. FLUOROSCOPY TIME:  Fluoroscopy Time: 50 minutes 30 seconds (1879 mGy). COMPLICATIONS: None immediate. TECHNIQUE: Following a full explanation of the procedure along with the potential associated complications, an informed witnessed consent was obtained from the patient's husband. The risks of intracranial hemorrhage of 10%, worsening neurological deficit, ventilator dependency, death and inability to revascularize were all reviewed in detail with the patient's husband. The patient was then put under general anesthesia by the Department of Anesthesiology at [facility]. The right groin was  prepped and draped in the usual sterile fashion. Thereafter using modified Seldinger technique, transfemoral access into the right common femoral artery was obtained without difficulty. Over a 0.035 inch guidewire a 5 French Pinnacle sheath was inserted. Through this, and also over a 0.035 inch guidewire a 5 [place] JB 1 catheter was advanced to the aortic arch region and selectively positioned in the left common carotid artery. FINDINGS: The left common carotid arteriogram demonstrates the left external carotid artery and its major branches to be widely patent. The left internal carotid artery at the bulb to the cranial skull base also demonstrates wide patency. The petrous, cavernous and supraclinoid segments are widely patent. There is a fusiform dilatation of the proximal cavernous segment. The left posterior communicating artery is seen opacifying the left posterior cerebral artery distribution. The left anterior cerebral artery opacifies into the capillary and venous phases the inferior division of the left middle cerebral artery is widely patent. There is a truncated dominant inferior division just distal to its origin with a large area of hypoperfusion involving the anterior 2/3 of the perisylvian subcortical areas of the left frontal lobe. PROCEDURE: The diagnostic JB 1 catheter in the left common carotid artery was exchanged over a 0.035 inch 300 cm Rosen exchange guidewire for an 8 [place] Pinnacle sheath. The side port of the sheath was then connected to continuous heparinized saline infusion. Over the [employer] guidewire, an 8 [place] 85 cm FlowGate balloon guide catheter which been prepped with 50% contrast and 50% heparinized saline infusion was positioned in the left common carotid bifurcation. The guidewire was removed. Good aspiration obtained from the hub of the [facility] guide catheter. Using biplane roadmap technique and constant fluoroscopic guidance, over a 0.035 inch Roadrunner guidewire,  the FlowGate guide catheter was advanced to the mid cervical segment of the left internal carotid artery. The guidewire was removed. Again good aspiration obtained from the hub of the [facility] guide catheter. No change was seen in the occluded left middle cerebral artery dominant inferior division. At this time a combination of a Catalyst 5 [place] 115 cm intermediate catheter inside of which was an [employer] microcatheter was advanced over a 0.014 inch Softip Synchro micro guidewire to the supraclinoid left ICA. The micro guidewire was then gently manipulated using a torque device in the left middle cerebral artery followed by the microcatheter. The micro guidewire was then advanced through the occluded dominant inferior division into the M2 M3 region followed by the microcatheter. The guidewire was removed. Good aspiration obtained from the hub of the microcatheter. Gentle control arteriogram demonstrated safe position of  tip of the microcatheter. This was then connected to continuous heparinized saline infusion. At this time, a 5 mm x 33 mm Embotrap retrieval device was advanced to the distal end of the microcatheter. The proximal and the distal landing zones were then defined. At this time, the 5 [place] Catalyst guide catheter could only be advanced to the supraclinoid left ICA. The O ring on the delivery microcatheter was then loosened. With slight gentle forward traction with the right hand on the delivery micro guidewire with the left hand the delivery microcatheter was retrieved unsheathing the retrieval device. A gentle control arteriogram performed through the Catalyst guide catheter in the supraclinoid left ICA demonstrated a TICI 2b revascularization. With proximal flow arrest initiated by inflating the balloon of the [facility] guide catheter in the left internal carotid artery, the combination of the retrieval device, the microcatheter, and the Catalyst guide catheter was then retrieved as constant  aspiration was applied with a 60 mL syringe at the hub of FlowGate guide catheter, and the Penumbra aspiration suction device at the hub of the 5 [place] Catalyst guide catheter. The combinations were retrieved and removed. Flow arrest was reversed by deflating the balloon of the [facility] guide catheter in the left internal carotid artery. Free back bleed of blood was noted. A control arteriogram performed through the [facility] guide catheter in the left internal carotid artery demonstrated no significant change in the occluded dominant superior division. This prompted a second pass at this time using the combination of the Trevo ProVue microcatheter inside of a 5 [place] 126 cm Sofia intermediate catheter. This combination was again advanced over a 0.014 inch Softip Synchro micro guidewire to the supraclinoid left ICA. Again using a torque device the micro guidewire was advanced through the occluded left middle cerebral artery superior division into the M2 M3 region followed by the microcatheter. The guidewire was removed. Good aspiration obtained from the hub of the microcatheter. A gentle control arteriogram through the micro microcatheter identified safe position of the tip of the microcatheter. This was then connected to continuous heparinized saline infusion. The 5 mm x 33 mm Embotrap retrieval device was advanced to the distal end of the microcatheter. Again this was deployed as described above. A gentle control arteriogram performed through the [facility] guide catheter in the proximal left middle cerebral artery again demonstrated a TICI 2b revascularization. Following deployment, the [facility] guide catheter was advanced to capture the proximal portion of the retrieval device. With constant aspiration being applied at the hub of the [facility] guide catheter with a 60 mL syringe, and at the hub of the [place] guide catheter, the combination was retrieved and removed. Upon removal, flow arrest was reversed. A  gentle control arteriogram performed through the [facility] guide catheter in the left internal carotid artery demonstrated initially mildly improved caliber of the occluded superior division. Subsequent arteriograms performed over a few minutes with 25 mcg aliquots of nitroglycerin demonstrated gradual improved caliber of the occluded superior division from proximal to distally. This was accelerated by giving the patient a total of 3 mg of Integrilin intra-arterially into the left middle cerebral artery. A final control performed through the [facility] guide catheter in the supraclinoid left ICA demonstrated significantly improved caliber and flow through the left MCA trifurcation branches, especially the superior division. The left anterior cerebral artery remained patent. Throughout the procedure, the patient's blood pressure and neurological status remained stable. No evidence of mass effect or midline shift, or of extravasation was seen. The  5 [place] intermediate catheter, and the [facility] guide catheter were retrieved and removed. The 8 French Pinnacle sheath was left in situ and connected to continuous heparinized saline infusion. A follow-up CT scan of the brain revealed no evidence of intracranial hemorrhage, mass effect or midline shift. The right groin sheath was left in situ on arterial flush via the patient being on Xarelto. The right groin appeared soft without evidence of hematoma or bleeding. Distal pulses remained Dopplerable in the posterior tibials, and dorsalis pedis arteries bilaterally. The patient was then extubated without difficulty. She was able to maintain her O2 saturations. She exhibited spontaneous movement of her upper and lower extremities. She was transferred to the neuro PACU and then to neuro ICU to continue with post thrombectomy management. IMPRESSION: Status post endovascular complete revascularization of occluded dominant inferior division of the left middle cerebral artery with 2  passes with a 5 mm x 33 mm Embotrap retrieval device achieving a TICI 2b revascularization. PLAN: Follow-up in the clinic 4 weeks post discharge. Electronically Signed   By: Luanne Bras M.D.   On: 02/03/2019 16:41   Dg Chest Port 1 View  Result Date: 02/06/2019 CLINICAL DATA:  Respiratory failure EXAM: PORTABLE CHEST 1 VIEW COMPARISON:  Yesterday FINDINGS: Endotracheal tube tip between the clavicular heads and carina. Feeding tube with tip at the distal stomach or proximal duodenum. Right upper extremity PICC with tip in good position. Streaky density at the bases. No edema, effusion, or pneumothorax. Known smoothly contoured nodule over the left chest as described on December 2019 chest CT. IMPRESSION: 1. Stable hardware positioning. 2. Streaky densities at the bases favoring atelectasis. Electronically Signed   By: Monte Fantasia M.D.   On: 02/06/2019 08:15   Dg Chest Port 1 View  Result Date: 02/05/2019 CLINICAL DATA:  Bedside PICC placement. Ventilator dependent respiratory failure. EXAM: PORTABLE CHEST 1 VIEW COMPARISON:  Portable chest x-ray earlier same day at 12:35 a.m., 02/03/2019 and previously. FINDINGS: RIGHT arm PICC tip projects at or near the expected location of the cavoatrial junction. Endotracheal tube tip in satisfactory position approximately 3 cm above the carina. Feeding tube courses below the diaphragm with its tip likely in the region of the gastric antrum or duodenal bulb, unchanged. Stable moderate cardiomegaly. Stable mild bibasilar atelectasis. No new pulmonary parenchymal abnormalities. IMPRESSION: 1. RIGHT arm PICC tip projects at or near the cavoatrial junction. 2. Remaining support apparatus also satisfactory. 3. Stable cardiomegaly. Stable mild bibasilar atelectasis. No new abnormalities. Electronically Signed   By: Evangeline Dakin M.D.   On: 02/05/2019 14:06   Dg Chest Port 1 View  Result Date: 02/05/2019 CLINICAL DATA:  Intubation EXAM: PORTABLE CHEST 1 VIEW  COMPARISON:  February 03, 2019 FINDINGS: The heart size is enlarged. Endotracheal tube is identified distal tip 3.2 cm from carina. The aorta is uncoiled. Feeding tube is identified with distal tip in the distal stomach. Both lungs are clear. There is no pneumothorax. The visualized skeletal structures are stable. IMPRESSION: Endotracheal tube is identified in good position. There is no pneumothorax. Cardiomegaly. Electronically Signed   By: Abelardo Diesel M.D.   On: 02/05/2019 00:54   Ir Percutaneous Art Thrombectomy/infusion Intracranial Inc Diag Angio  Result Date: 02/06/2019 INDICATION: New onset of aphasia and right-sided weakness.  Right facial droop. CT angiogram of the head and neck revealing occlusion of a dominant superior division of the left MCA. EXAM: 1. EMERGENT LARGE VESSEL OCCLUSION THROMBOLYSIS anterior CIRCULATION) COMPARISON:  CT angiogram of the head and neck  of 02/03/2017. MEDICATIONS: Vancomycin 1 g IV antibiotic was administered within 1 hour of the procedure. ANESTHESIA/SEDATION: General anesthesia. CONTRAST:  Isovue 300 approximately 75 mL. FLUOROSCOPY TIME:  Fluoroscopy Time: 50 minutes 30 seconds (1879 mGy). COMPLICATIONS: None immediate. TECHNIQUE: Following a full explanation of the procedure along with the potential associated complications, an informed witnessed consent was obtained from the patient's husband. The risks of intracranial hemorrhage of 10%, worsening neurological deficit, ventilator dependency, death and inability to revascularize were all reviewed in detail with the patient's husband. The patient was then put under general anesthesia by the Department of Anesthesiology at [facility]. The right groin was prepped and draped in the usual sterile fashion. Thereafter using modified Seldinger technique, transfemoral access into the right common femoral artery was obtained without difficulty. Over a 0.035 inch guidewire a 5 French Pinnacle sheath was inserted.  Through this, and also over a 0.035 inch guidewire a 5 [place] JB 1 catheter was advanced to the aortic arch region and selectively positioned in the left common carotid artery. FINDINGS: The left common carotid arteriogram demonstrates the left external carotid artery and its major branches to be widely patent. The left internal carotid artery at the bulb to the cranial skull base also demonstrates wide patency. The petrous, cavernous and supraclinoid segments are widely patent. There is a fusiform dilatation of the proximal cavernous segment. The left posterior communicating artery is seen opacifying the left posterior cerebral artery distribution. The left anterior cerebral artery opacifies into the capillary and venous phases the inferior division of the left middle cerebral artery is widely patent. There is a truncated dominant inferior division just distal to its origin with a large area of hypoperfusion involving the anterior 2/3 of the perisylvian subcortical areas of the left frontal lobe. PROCEDURE: The diagnostic JB 1 catheter in the left common carotid artery was exchanged over a 0.035 inch 300 cm Rosen exchange guidewire for an 8 [place] Pinnacle sheath. The side port of the sheath was then connected to continuous heparinized saline infusion. Over the [employer] guidewire, an 8 [place] 85 cm FlowGate balloon guide catheter which been prepped with 50% contrast and 50% heparinized saline infusion was positioned in the left common carotid bifurcation. The guidewire was removed. Good aspiration obtained from the hub of the [facility] guide catheter. Using biplane roadmap technique and constant fluoroscopic guidance, over a 0.035 inch Roadrunner guidewire, the FlowGate guide catheter was advanced to the mid cervical segment of the left internal carotid artery. The guidewire was removed. Again good aspiration obtained from the hub of the [facility] guide catheter. No change was seen in the occluded left middle  cerebral artery dominant inferior division. At this time a combination of a Catalyst 5 [place] 115 cm intermediate catheter inside of which was an [employer] microcatheter was advanced over a 0.014 inch Softip Synchro micro guidewire to the supraclinoid left ICA. The micro guidewire was then gently manipulated using a torque device in the left middle cerebral artery followed by the microcatheter. The micro guidewire was then advanced through the occluded dominant inferior division into the M2 M3 region followed by the microcatheter. The guidewire was removed. Good aspiration obtained from the hub of the microcatheter. Gentle control arteriogram demonstrated safe position of tip of the microcatheter. This was then connected to continuous heparinized saline infusion. At this time, a 5 mm x 33 mm Embotrap retrieval device was advanced to the distal end of the microcatheter. The proximal and the distal landing zones  were then defined. At this time, the 5 [place] Catalyst guide catheter could only be advanced to the supraclinoid left ICA. The O ring on the delivery microcatheter was then loosened. With slight gentle forward traction with the right hand on the delivery micro guidewire with the left hand the delivery microcatheter was retrieved unsheathing the retrieval device. A gentle control arteriogram performed through the Catalyst guide catheter in the supraclinoid left ICA demonstrated a TICI 2b revascularization. With proximal flow arrest initiated by inflating the balloon of the [facility] guide catheter in the left internal carotid artery, the combination of the retrieval device, the microcatheter, and the Catalyst guide catheter was then retrieved as constant aspiration was applied with a 60 mL syringe at the hub of FlowGate guide catheter, and the Penumbra aspiration suction device at the hub of the 5 [place] Catalyst guide catheter. The combinations were retrieved and removed. Flow arrest was reversed by  deflating the balloon of the [facility] guide catheter in the left internal carotid artery. Free back bleed of blood was noted. A control arteriogram performed through the [facility] guide catheter in the left internal carotid artery demonstrated no significant change in the occluded dominant superior division. This prompted a second pass at this time using the combination of the Trevo ProVue microcatheter inside of a 5 [place] 126 cm Sofia intermediate catheter. This combination was again advanced over a 0.014 inch Softip Synchro micro guidewire to the supraclinoid left ICA. Again using a torque device the micro guidewire was advanced through the occluded left middle cerebral artery superior division into the M2 M3 region followed by the microcatheter. The guidewire was removed. Good aspiration obtained from the hub of the microcatheter. A gentle control arteriogram through the micro microcatheter identified safe position of the tip of the microcatheter. This was then connected to continuous heparinized saline infusion. The 5 mm x 33 mm Embotrap retrieval device was advanced to the distal end of the microcatheter. Again this was deployed as described above. A gentle control arteriogram performed through the [facility] guide catheter in the proximal left middle cerebral artery again demonstrated a TICI 2b revascularization. Following deployment, the [facility] guide catheter was advanced to capture the proximal portion of the retrieval device. With constant aspiration being applied at the hub of the [facility] guide catheter with a 60 mL syringe, and at the hub of the [place] guide catheter, the combination was retrieved and removed. Upon removal, flow arrest was reversed. A gentle control arteriogram performed through the [facility] guide catheter in the left internal carotid artery demonstrated initially mildly improved caliber of the occluded superior division. Subsequent arteriograms performed over a few minutes with 25 mcg  aliquots of nitroglycerin demonstrated gradual improved caliber of the occluded superior division from proximal to distally. This was accelerated by giving the patient a total of 3 mg of Integrilin intra-arterially into the left middle cerebral artery. A final control performed through the [facility] guide catheter in the supraclinoid left ICA demonstrated significantly improved caliber and flow through the left MCA trifurcation branches, especially the superior division. The left anterior cerebral artery remained patent. Throughout the procedure, the patient's blood pressure and neurological status remained stable. No evidence of mass effect or midline shift, or of extravasation was seen. The 5 French intermediate catheter, and the [facility] guide catheter were retrieved and removed. The 8 French Pinnacle sheath was left in situ and connected to continuous heparinized saline infusion. A follow-up CT scan of the brain revealed no evidence of intracranial  hemorrhage, mass effect or midline shift. The right groin sheath was left in situ on arterial flush via the patient being on Xarelto. The right groin appeared soft without evidence of hematoma or bleeding. Distal pulses remained Dopplerable in the posterior tibials, and dorsalis pedis arteries bilaterally. The patient was then extubated without difficulty. She was able to maintain her O2 saturations. She exhibited spontaneous movement of her upper and lower extremities. She was transferred to the neuro PACU and then to neuro ICU to continue with post thrombectomy management. IMPRESSION: Status post endovascular complete revascularization of occluded dominant inferior division of the left middle cerebral artery with 2 passes with a 5 mm x 33 mm Embotrap retrieval device achieving a TICI 2b revascularization. PLAN: Follow-up in the clinic 4 weeks post discharge. Electronically Signed   By: Luanne Bras M.D.   On: 02/03/2019 16:41   [place] Ekg Site Rite  Result Date:  02/05/2019 If Site Rite image not attached, placement could not be confirmed due to current cardiac rhythm.   Transthoracic Echocardiogram  Ejection fraction is greater than 65%. Severe asymmetric left ventricular hypertrophy.     HISTORY OF PRESENT ILLNESS [name] [name] is an 85 y.o. female with past medical history of hypertension, hyperlipidemia, atrial fibrillation on Xarelto presents to [facility] as a code stroke. She was last known well at 8:00 02/03/2019 when she was talking with her husband, and suddenly patient had trouble getting her words out.  EMS was called and noted to be aphasic and had right-sided weakness.  The patient was taken to [facility] emergency room where she was stroke alerted.  She was evaluated by tele-neurology, she did not receive IV TPA as she was already on Xarelto. CT head was negative for hemorrhage, aspects of 8.  CT angiogram showed a left M2 occlusion and was contacted about the results.  Spoke with [facility] ER physician and recommended transfer to [facility] for emergent mechanical thrombectomy. On arrival, patient had initial NIH stroke scale of 10 for aphasia, right facial droop and right hemiparesis.  Patient was taken immediately to IR.   HOSPITAL COURSE Ms. Terrye Dombrosky is a 85 y.o. female with history of hypertension, hyperlipidemia,atrial fibrillation on Xarelto presenting with aphasia, right facial droop and right hemiparesis. She did not receive IV t-PA due to anticoagulation with Xarelto. Angio showed M2 occlusion with TICI2b revascularization following mechanical thrombectomy using embotrap in IR.  Stroke:  Multiple left MCA territory infarct s/p IR w/ TICI2b reperfusion - embolic - afib on xarelto  CT head - OSH  MRI head - Moderate-sized acute nonhemorrhagic ischemic anterior left MCA territory infarct. Few additional small volume cortical infarcts involving the mesial left temporal lobe and left occipital pole, likely related to  fetal type left PCA seen on prior CTA. Additional 6 mm acute ischemic nonhemorrhagic right cerebellar infarct.   CTA H&N - OSH - left M2 occlusion  cerebral angiogram occluded inferior division L MCA with embotrap x 2 -> TICI2b revascularization   2D Echo - ejection fraction greater than 65%. No wall motion abnormalities.  LDL - 63   HgbA1c - 5.1  UDS - negative  Xarelto (rivaroxaban) daily prior to admission, treated with heparin IV in hospital  Poor neuro prognosis. Family has opted for comfort care  Disposition:  [facility] facility  Respiratory failure  Following seizure  required intubation  Family made no CODE BLUE  One-way extubation  Started on Versed drip given allergy to morphine at time of  extubation.  Will transition to residential hospice home per recommendations with palliative care:  Ativan IV 2 mg bolus ~30 minutes prior to transfer and stopping Versed infusion and then restarting Versed infusion once she arrives to hospice facility.   atrial Fibrillation  Home anticoagulation:  Xarelto (rivaroxaban) daily   Cardiology consulted  Hypertension  Hyperlipidemia  Lipid lowering medication PTA: Zocor 20 mg daily  LDL 63, at goal < 70  Other Stroke Risk Factors  Advanced age  Former cigarette smoker - quit  Other Active Problems  Hypokalemia   Hypomagnesemia   Anemia on IV heparin drip - transfused 1 unit in hospital  Leukocytosis - 16.7 (temp 99) (U/A and CXR not C/W infection) monitor  Creatinine - 1.28  Elevated troponin enzymes - trending down - may be due to RVR   Symptomatic seizures secondary to stroke on 02/04/19-Keppra started  Malnutrition d/t chronic illness   DISCHARGE EXAM per Dr. Leonie Man Blood pressure (!) 214/88, pulse (!) 106, temperature 97.8 F (36.6 C), temperature source Oral, resp. rate 20, height 5' (1.524 m), weight 66.5 kg, SpO2 93 %. Elderly Caucasian lady who is   not in distress. .  Afebrile. Head is nontraumatic. Neck is supple without bruit.    Cardiac exam no murmur or gallop. Lungs are clear to auscultation. Distal pulses are well felt. Neurological Exam :  She is   awake interactive  But remains. globally aphasic and does not follow commands consistently  She has left gaze preference but will follow gaze to the right past midline. She blinks to threat on the left but less on the right. Right lower facial weakness. Tongue midline. Motor system exam able to move left upper and lower extremities spontaneously and purposefully. Withdraws from the right lower extremity greater than upper extremity to painful stimuli. Deep tendon reflexes symmetric. Sensation appears intact bilaterally. Plantars downgoing. Gait not tested.  Discharge Diet   NPO  DISCHARGE PLAN  Disposition:  [facility] facility  Continue versed drip. Transition w/ injection  F/u provider at [facility]  35 minutes were spent preparing discharge.  Burnetta Sabin, MSN, APRN, ANVP-BC, AGPCNP-BC Advanced Practice Stroke Nurse [place] for Schedule & Pager information 02/08/2019 2:57 PM   I have personally obtained history,examined this patient, reviewed notes, independently viewed imaging studies, participated in medical decision making and plan of care.ROS completed by me personally and pertinent positives fully documented  I have made any additions or clarifications directly to the above note. Agree with note above.   Antony Contras, MD Medical Director [facility] Stroke Center Pager: [phone] 02/08/2019 3:54 PM

## 2019-02-08 NOTE — Progress Notes (Signed)
Pt got picked up by ambulance to be transported to [facility]. Wasted Versed 15 ml in stericycle with Patrina Levering, RN.

## 2019-02-08 NOTE — Social Work (Signed)
CSW spoke with bedside RN, pt family requesting [facility]. Will make referral for hospice home.  Westley Hummer, MSW, [place] Work [phone]

## 2019-02-08 NOTE — Progress Notes (Signed)
Report given to Jenny Reichmann, Therapist, sports at [place]

## 2019-02-08 NOTE — Care Management Important Message (Signed)
Important Message  Patient Details  Name: [name] [name] MRN: [id number] Date of Birth: Sep 24, 1933   Medicare Important Message Given:  Yes    Jerri Glauser Montine Circle 02/08/2019, 1:31 PM

## 2019-02-08 NOTE — Progress Notes (Signed)
Palliative Medicine RN Note: Consult order noted for symptom management, as well as order for residential hospice placement. If a bed is found for [name] [name] before our team can see her for further recs, IV Versed for seizure suppression can be continued at residential hospice.   Our team will see her when we have an available provider.  Marjie Skiff Rayvion Stumph, RN, BSN, [facility] Palliative Medicine Team 02/08/2019 10:07 AM Office [phone]

## 2019-02-15 ENCOUNTER — Institutional Professional Consult (permissible substitution): Payer: Medicare Other | Admitting: Cardiology

## 2019-02-19 DEATH — deceased

## 2019-03-01 ENCOUNTER — Ambulatory Visit: Payer: Medicare Other | Admitting: Cardiology

## 2019-03-23 DIAGNOSIS — Z515 Encounter for palliative care: Secondary | ICD-10-CM

## 2019-03-23 NOTE — Consult Note (Signed)
Consultation Note Date: 03/23/2019   Patient Name: [name] [name]  DOB: 1933-06-28  MRN: [id number]  Age / Sex: 85 y.o., female  PCP: Nodal, Alphonzo Dublin, PA-C Referring Physician: No att. providers found  Reason for Consultation: Establishing goals of care  HPI/Patient Profile: 85 y.o. female  with past medical history of HTN, HLD, atrial fibrillation admitted on 02/03/2019 with right facial droop and hemiparesis with multiple left MCA infarcts followed by seizure activity, respiratory failure, and no recovery of mental state. Overall prognosis is poor and family has chosen comfort measures.   Clinical Assessment and Goals of Care: I have reviewed records, medications, and discussed plan with pharmacy, hospice liaison, and bedside RN. I discussed plan with family at bedside Discussed comfort measures and expectations at hospice facility. They agree with transition to hospice facility and want to ensure that she remains comfortable without suffering. I reassured them this is our primary goal. They are prepared for transition to hospice facility.   Hospice will be able to continue Versed infusion for seizure suppression at hospice facility. I have spoken with pharmacy. I am recommending Ativan IV 2 mg bolus ~30 minutes prior to transfer and stopping Versed infusion and then restarting Versed infusion once she arrives to hospice facility.     SUMMARY OF RECOMMENDATIONS   - Transition to hospice facility for end of life care  Code Status/Advance Care Planning:  DNR   Symptom Management:   Fentanyl to be given prior to transfer.  Ativan to be given prior to transfer.   Discussed above plan for medications with bedside RN.   Palliative Prophylaxis:   Frequent Pain Assessment, Oral Care and Turn Reposition  Additional Recommendations (Limitations, Scope, Preferences):  Full Comfort Care   Psycho-social/Spiritual:   Desire for further Chaplaincy support:yes  Additional Recommendations: Grief/Bereavement Support  Prognosis:   < 2 weeks  Discharge Planning: Hospice facility      Primary Diagnoses: Present on Admission: . Middle cerebral artery embolism, left, embolic secondary to A. fib on anticoagulation status post mechanical thrombectomy   I have reviewed the medical record, interviewed the patient and family, and examined the patient. The following aspects are pertinent.  Past Medical History:  Diagnosis Date  . Abnormal EKG   . HLD (hyperlipidemia)   . HTN (hypertension)    Social History   Socioeconomic History  . Marital status: Married    Spouse name: Not on file  . Number of children: Not on file  . Years of education: Not on file  . Highest education level: Not on file  Occupational History  . Occupation: Retired  Scientific laboratory technician  . Financial resource strain: Not on file  . Food insecurity:    Worry: Not on file    Inability: Not on file  . Transportation needs:    Medical: Not on file    Non-medical: Not on file  Tobacco Use  . Smoking status: Former Smoker    Last attempt to quit: 05/21/1990    Years since quitting: 28.8  .  Smokeless tobacco: Never Used  Substance and Sexual Activity  . Alcohol use: Not on file  . Drug use: No  . Sexual activity: Not on file  Lifestyle  . Physical activity:    Days per week: Not on file    Minutes per session: Not on file  . Stress: Not on file  Relationships  . Social connections:    Talks on phone: Not on file    Gets together: Not on file    Attends religious service: Not on file    Active member of club or organization: Not on file    Attends meetings of clubs or organizations: Not on file    Relationship status: Not on file  Other Topics Concern  . Not on file  Social History Narrative  . Not on file   Family History  Problem Relation Age of Onset  . Aneurysm Other   . Cancer Other     Scheduled Meds: Continuous Infusions: PRN Meds:. Allergies  Allergen Reactions  . Amoxicillin-Pot Clavulanate Other (See Comments)    Unknown reaction more than 50 years ago. Did it involve swelling of the face/tongue/throat, SOB, or low BP? unknown Did it involve sudden or severe rash/hives, skin peeling, or any reaction on the inside of your mouth or nose? Unknown Did you need to seek medical attention at a hospital or doctor's office? Unknown When did it last happen?prior to 85 yrs old If all above answers are "NO", may proceed with cephalosporin use.  . Codeine Other (See Comments)    Mild hallucinations  . Eliquis [Apixaban] Hives  . Metoclopramide Hcl Other (See Comments)    Unknown reaction  . Morphine Other (See Comments)    hallucinations  . Nadolol Other (See Comments)    Runny nose, cough  . Sulfamethoxazole-Trimethoprim Diarrhea  . Valacyclovir Diarrhea   Review of Systems  Unable to perform ROS   Physical Exam Vitals signs and nursing note reviewed.  Cardiovascular:     Rate and Rhythm: Tachycardia present.  Pulmonary:     Effort: Tachypnea and respiratory distress present.  Abdominal:     General: Abdomen is flat.  Neurological:     Mental Status: She is unresponsive.     Vital Signs: BP (!) 214/88   Pulse (!) 106   Temp 97.8 F (36.6 C) (Oral)   Resp 20   Ht 5' (1.524 m)   Wt 66.5 kg   SpO2 93%   BMI 28.63 kg/m  Pain Scale: PAINAD       SpO2: SpO2: 93 % O2 Device:SpO2: 93 % O2 Flow Rate: .O2 Flow Rate (L/min): 2 L/min  IO: Intake/output summary: No intake or output data in the 24 hours ending 03/23/19 1239  LBM: Last BM Date: 02/08/19 Baseline Weight: Weight: 66.5 kg Most recent weight: Weight: 66.5 kg     Palliative Assessment/Data:   Flowsheet Rows     Most Recent Value  Intake Tab  Unit at Time of Referral  Med/Surg Unit  Date Notified  02/08/19  Palliative Care Type  Not seen  Reason Not Seen  Discharged before seen   Reason for referral  Non-pain Symptom, Counsel Regarding Hospice  Date of Admission  02/03/19  # of days IP prior to Palliative referral  5  Clinical Assessment  Psychosocial & Spiritual Assessment  Palliative Care Outcomes      Time Total: 30 min  Greater than 50%  of this time was spent counseling and coordinating care related to the  above assessment and plan.  Signed by: Vinie Sill, NP Palliative Medicine Team Pager # [phone] (M-F 8a-5p) Team Phone # [phone] (Nights/Weekends)

## 2019-04-03 ENCOUNTER — Institutional Professional Consult (permissible substitution): Payer: Medicare Other | Admitting: Cardiology

## 2019-10-18 IMAGING — CT CT HEAD W/O CM
4 series · 16 of 47 positions shown, 18 images · non-contrast
Comparison: Head CT dated 02/03/2019 and brain MRI dated 02/04/2019

CLINICAL DATA: Stroke follow-up, less responsive, seizures.

EXAM:
CT HEAD WITHOUT CONTRAST
TECHNIQUE: Contiguous axial images were obtained from the base of the skull
through the vertex without intravenous contrast.

[Series 3: head wo · axial · 0.39mm/px · z∈[+1296,+1416]mm · 7 of 33 slices shown, 9 images]
[im 5/33  brain]
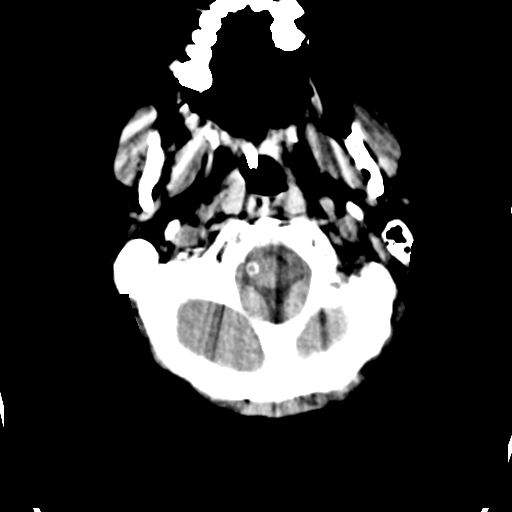
[im 5/33  bone]
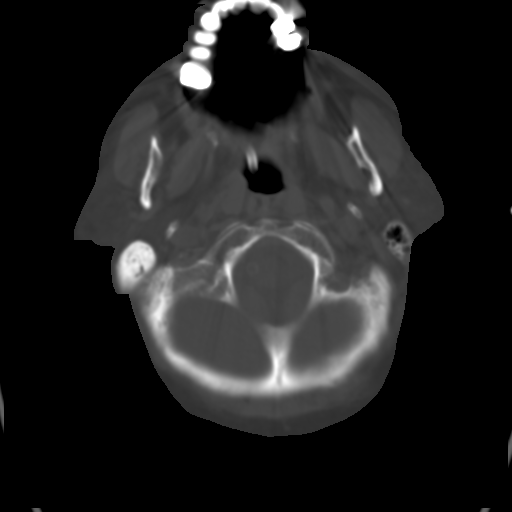
[im 9/33  brain]
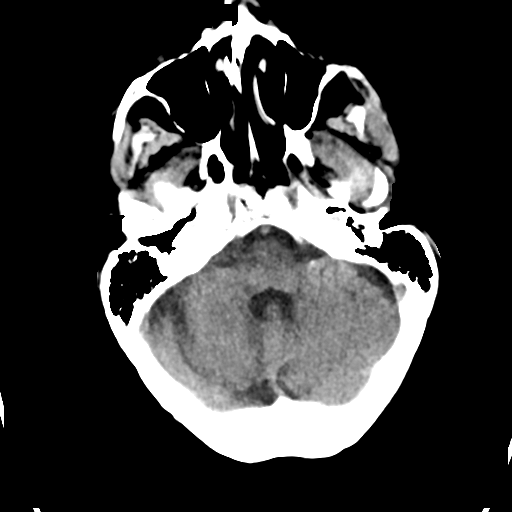
[im 13/33  brain]
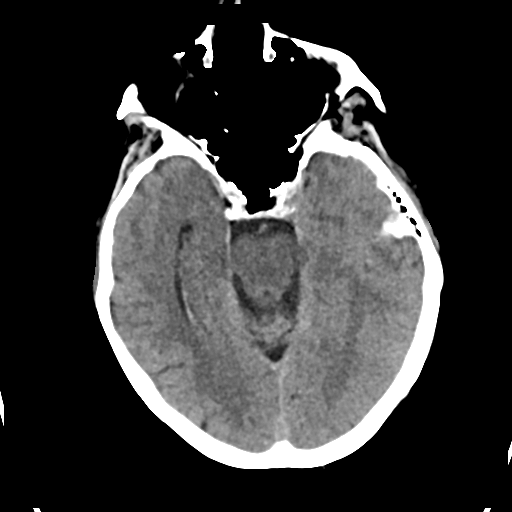
[im 17/33  brain]
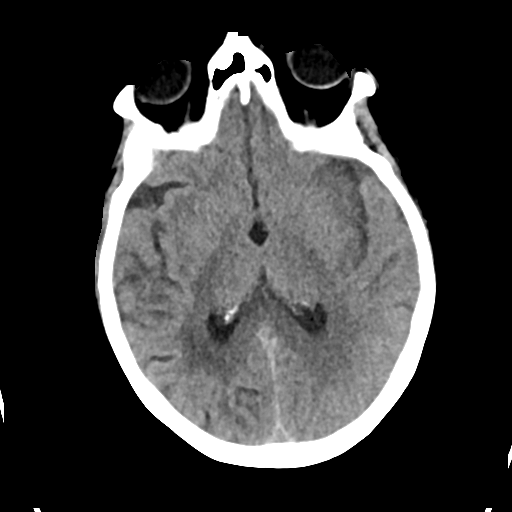
[im 21/33  brain]
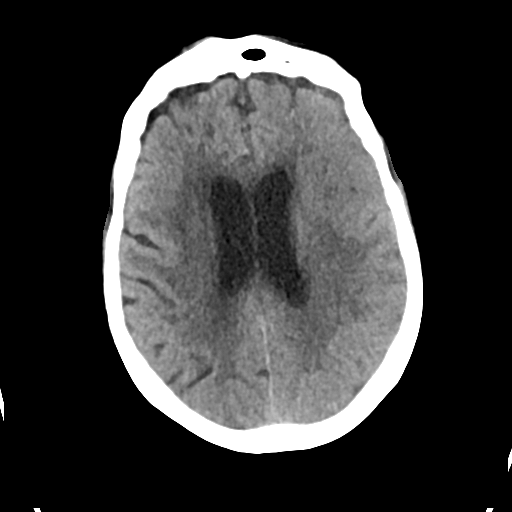
[im 21/33  bone]
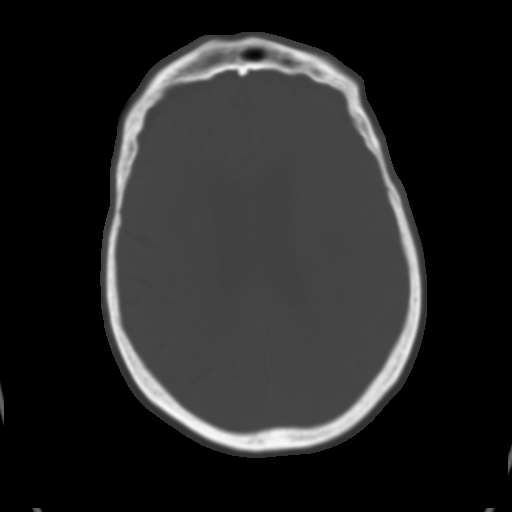
[im 25/33  brain]
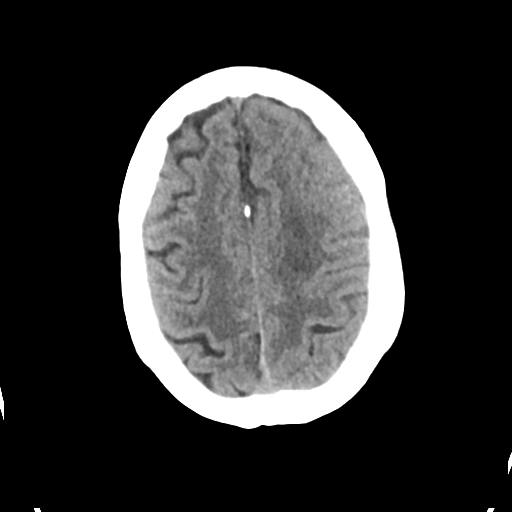
[im 29/33  brain]
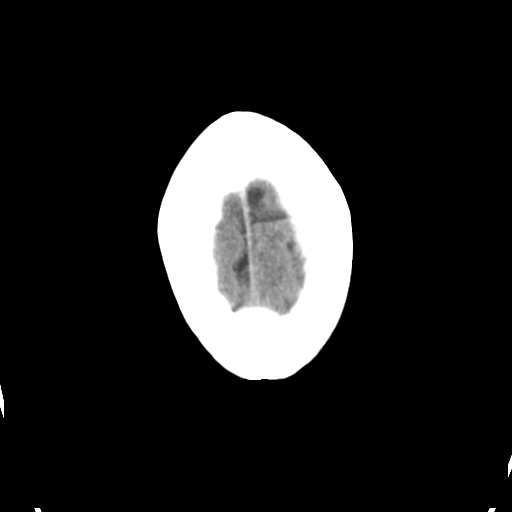

[Series 4: head bone · axial · 0.39mm/px · z∈[+1292,+1324]mm · 3 of 82 slices shown]
[im 9/82  bone]
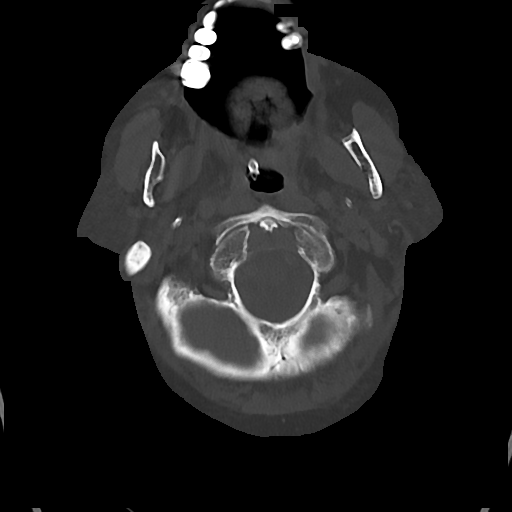
[im 17/82  bone]
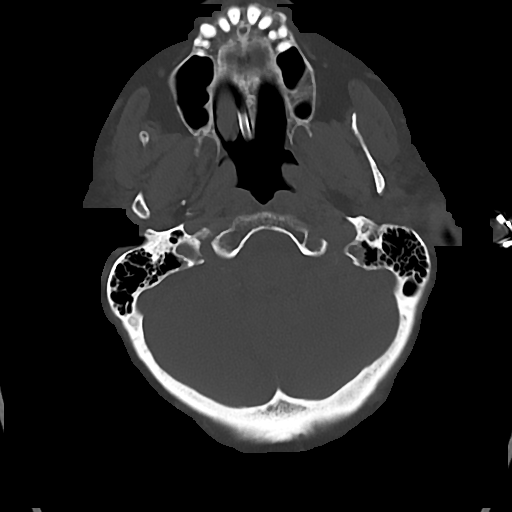
[im 25/82  bone]
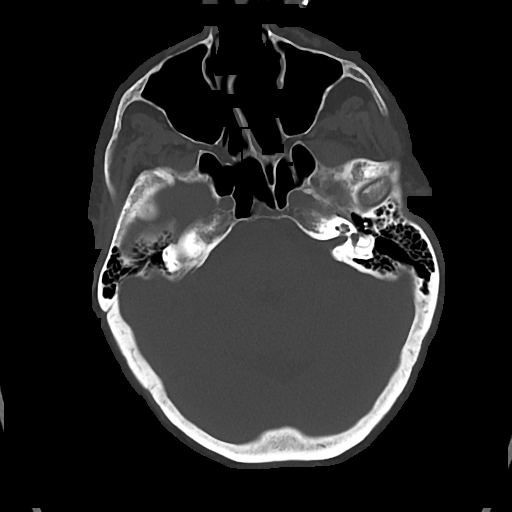

[Series 5: cor soft · coronal · 0.30mm/px · 3 of 66 slices shown]
[im 22/66  brain]
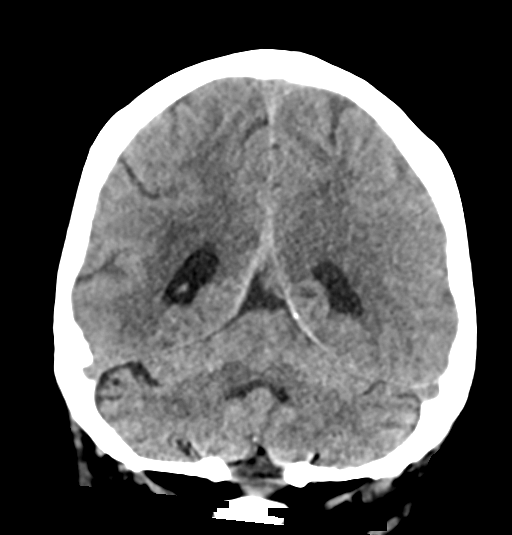
[im 29/66  brain]
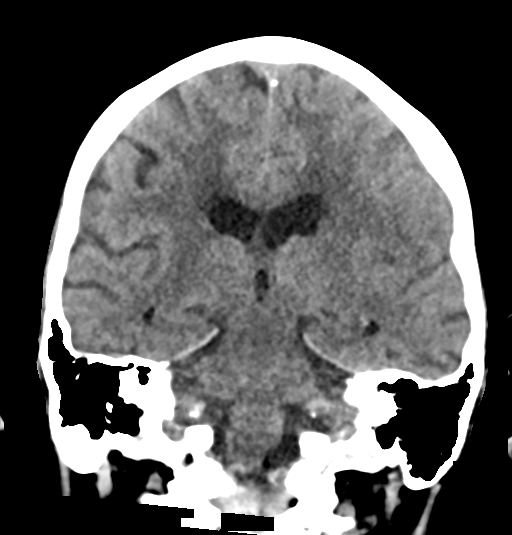
[im 37/66  brain]
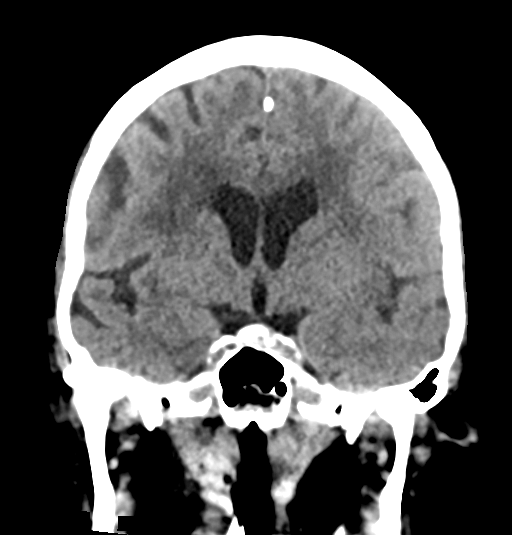

[Series 6: sag soft · sagittal · 0.32mm/px · 3 of 51 slices shown]
[im 17/51  brain]
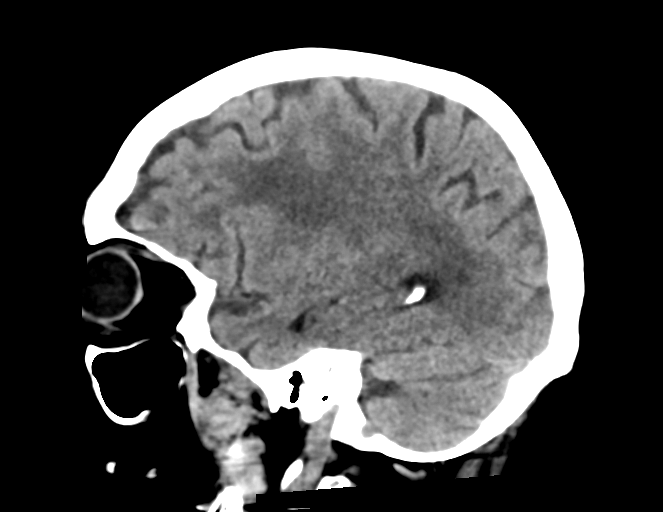
[im 26/51  brain]
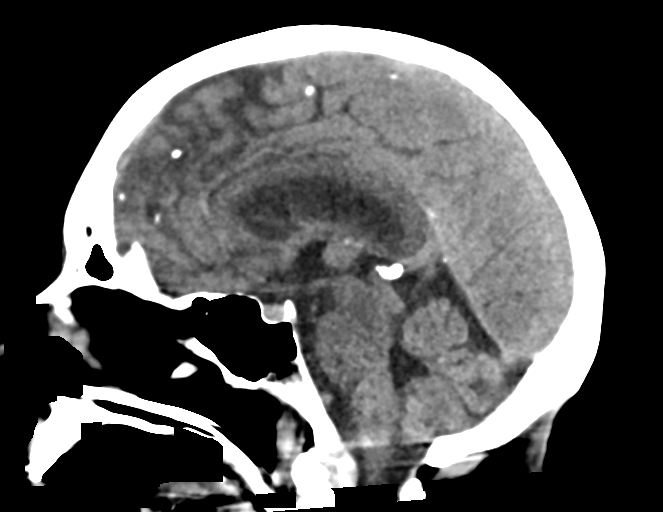
[im 34/51  brain]
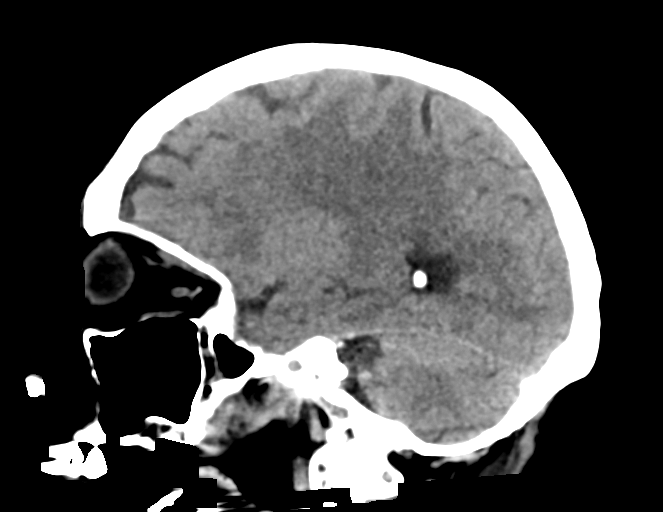

[16 of 47 positions shown; findings below may reference images not displayed]

FINDINGS: Brain: Ventricles are stable in size and configuration. Chronic
small vessel ischemic changes again noted within the bilateral
periventricular and subcortical white matter regions.

Subtle areas of low density are again seen within the region of the
LEFT insular ribbon and LEFT frontal operculum, not appreciably
changed in extent compared to the earlier head CT, corresponding to
the acute infarct demonstrated on subsequent brain MRI.

No parenchymal or extra-axial hemorrhage identified. No midline
shift or herniation.

Vascular: Chronic calcified atherosclerotic changes of the large
vessels at the skull base. No unexpected hyperdense vessel.

Skull: Normal. Negative for fracture or focal lesion.

Sinuses/Orbits: No acute finding.

Other: None.
IMPRESSION: 1. Stable head CT. The subtle edema at the level of the LEFT insular
ribbon and LEFT frontal operculum is unchanged in extent,
corresponding to the acute MCA infarct described on recent MRI.
2. No parenchymal or extra-axial hemorrhage seen. No significant
mass effect, midline shift or herniation.

## 2019-10-19 IMAGING — DX DG CHEST 1V PORT
1 series · 1 of 1 positions shown · non-contrast
Comparison: February 03, 2019

CLINICAL DATA: Intubation

EXAM:
PORTABLE CHEST 1 VIEW

[chest ap]
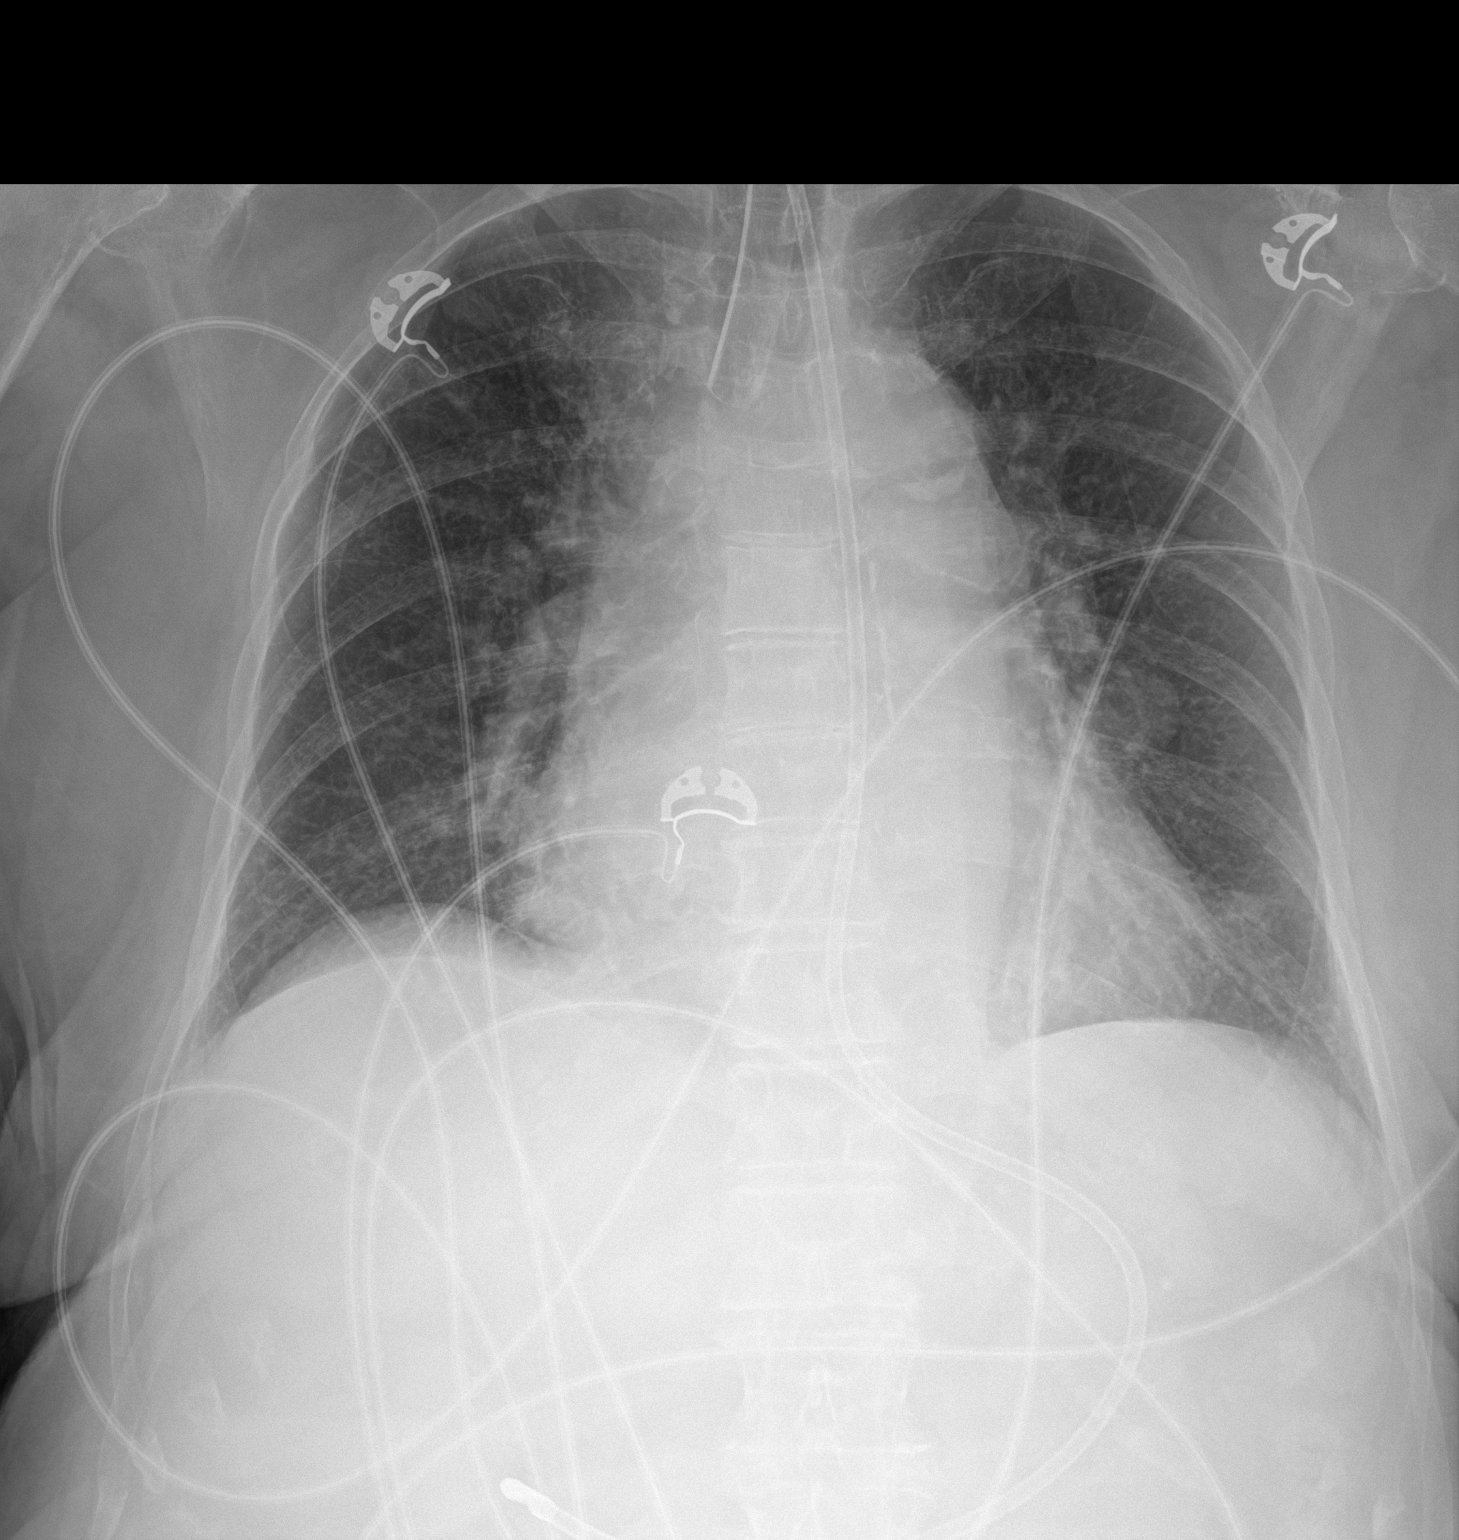

[1 of 1 positions shown; findings below may reference images not displayed]

FINDINGS: The heart size is enlarged. Endotracheal tube is identified distal
tip 3.2 cm from carina. The aorta is uncoiled. Feeding tube is
identified with distal tip in the distal stomach. Both lungs are
clear. There is no pneumothorax. The visualized skeletal structures
are stable.
IMPRESSION: Endotracheal tube is identified in good position. There is no
pneumothorax.

Cardiomegaly.

## 2019-10-20 IMAGING — DX DG CHEST 1V PORT
1 series · 1 of 1 positions shown · non-contrast
Comparison: Yesterday

CLINICAL DATA: Respiratory failure

EXAM:
PORTABLE CHEST 1 VIEW

[chest ap]
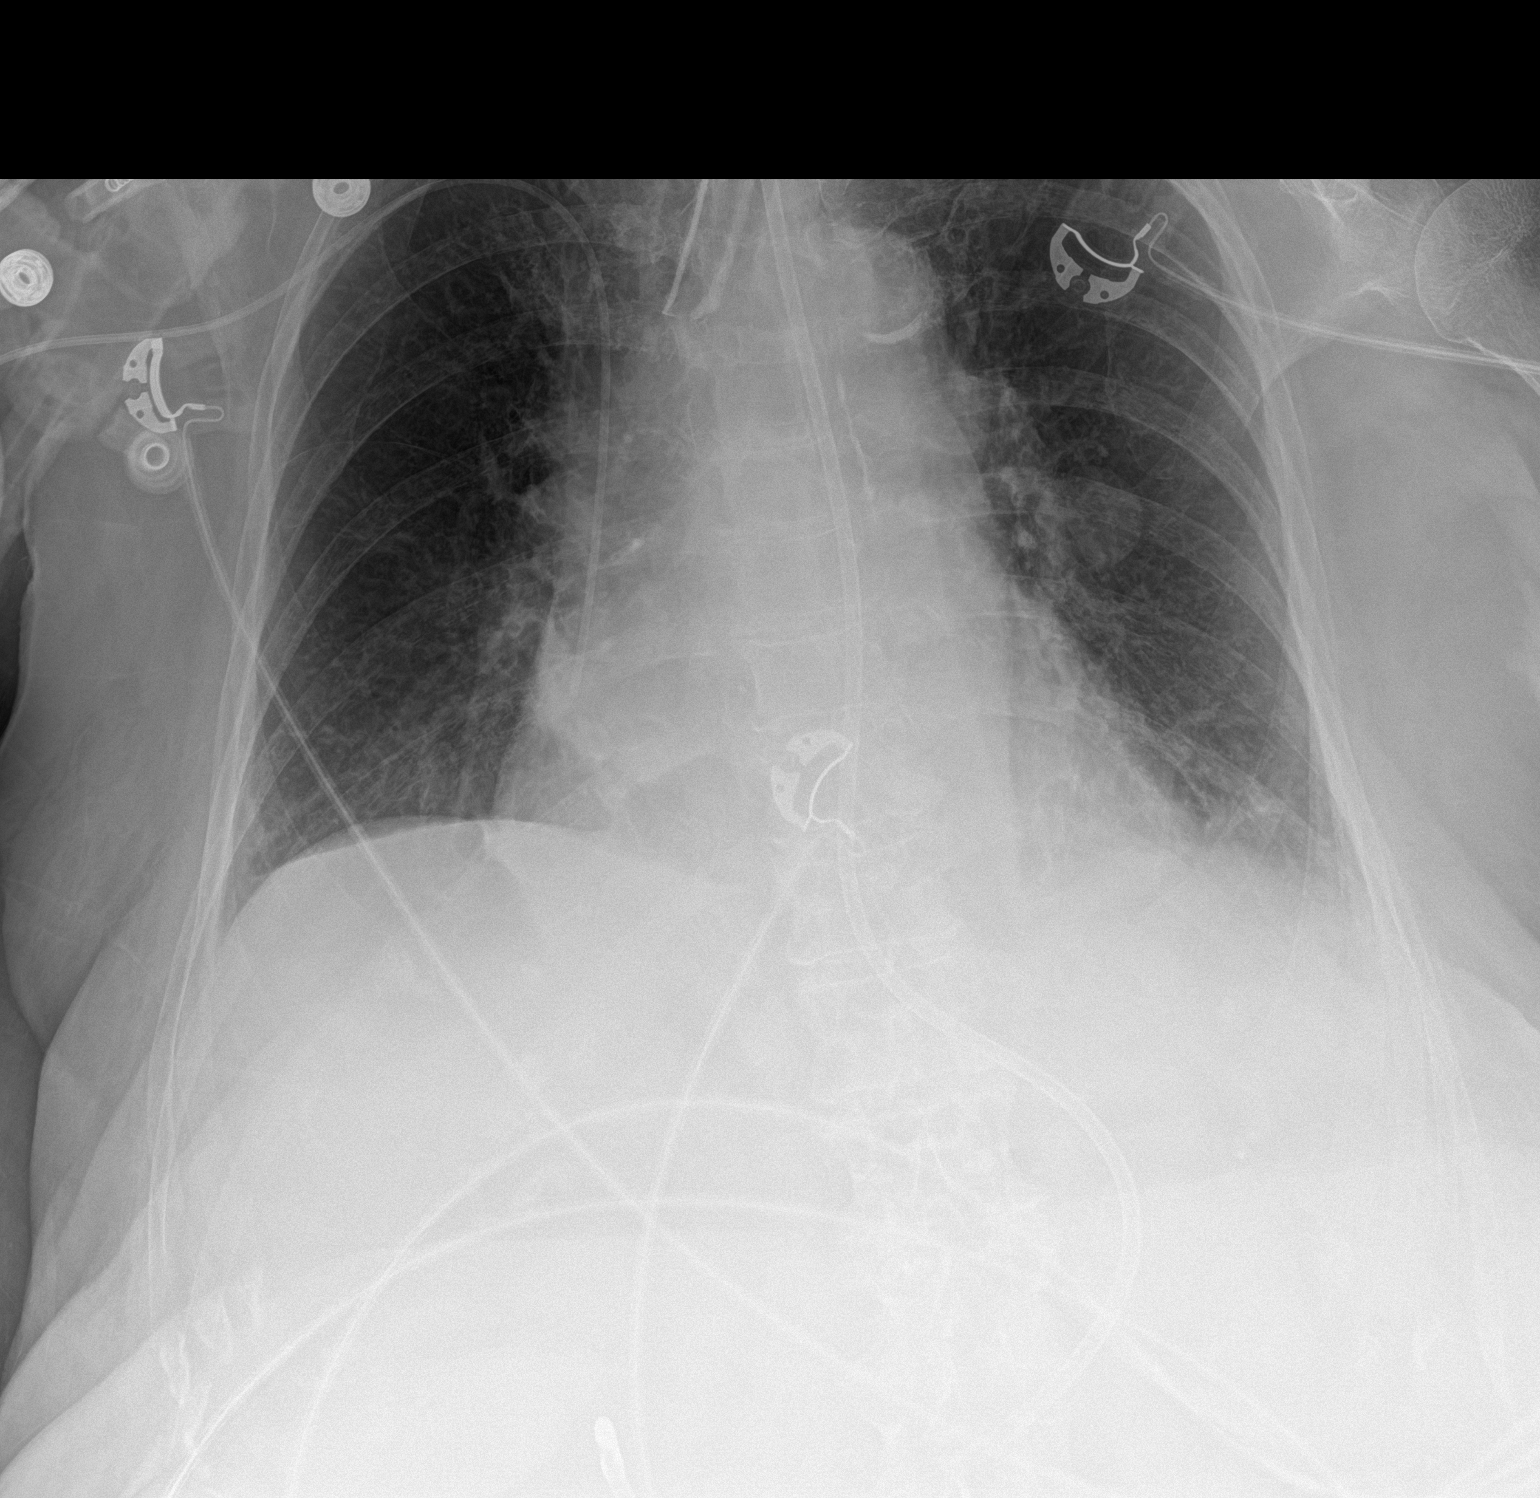

[1 of 1 positions shown; findings below may reference images not displayed]

FINDINGS: Endotracheal tube tip between the clavicular heads and carina.
Feeding tube with tip at the distal stomach or proximal duodenum.
Right upper extremity PICC with tip in good position.

Streaky density at the bases. No edema, effusion, or pneumothorax.
Known smoothly contoured nodule over the left chest as described on
November 2018 chest CT.
IMPRESSION: 1. Stable hardware positioning.
2. Streaky densities at the bases favoring atelectasis.
